# Patient Record
Sex: Female | Born: 1985 | Race: Black or African American | Hispanic: No | Marital: Single | Smoking: Never smoker
Health system: Southern US, Community
[De-identification: ages and names within clinical notes are randomized; demographics above are authoritative.]

## PROBLEM LIST (undated history)

## (undated) DIAGNOSIS — A630 Anogenital (venereal) warts: Secondary | ICD-10-CM

## (undated) DIAGNOSIS — Z8619 Personal history of other infectious and parasitic diseases: Secondary | ICD-10-CM

## (undated) DIAGNOSIS — D649 Anemia, unspecified: Secondary | ICD-10-CM

## (undated) DIAGNOSIS — B009 Herpesviral infection, unspecified: Secondary | ICD-10-CM

## (undated) HISTORY — DX: Anogenital (venereal) warts: A63.0

## (undated) HISTORY — DX: Anemia, unspecified: D64.9

## (undated) HISTORY — DX: Personal history of other infectious and parasitic diseases: Z86.19

## (undated) HISTORY — DX: Herpesviral infection, unspecified: B00.9

---

## 2006-07-25 HISTORY — PX: CHOLECYSTECTOMY: SHX55

## 2008-02-06 ENCOUNTER — Emergency Department (HOSPITAL_COMMUNITY): Admission: EM | Admit: 2008-02-06 | Discharge: 2008-02-06 | Payer: Self-pay | Admitting: Pediatrics

## 2010-07-21 ENCOUNTER — Other Ambulatory Visit
Admission: RE | Admit: 2010-07-21 | Discharge: 2010-07-21 | Payer: Self-pay | Source: Home / Self Care | Admitting: Obstetrics and Gynecology

## 2011-04-22 LAB — URINE CULTURE: Colony Count: >=100000

## 2011-04-22 LAB — POCT PREGNANCY, URINE: Preg Test, Ur: NEGATIVE

## 2011-04-22 LAB — URINE MICROSCOPIC-ADD ON

## 2011-04-22 LAB — URINALYSIS, ROUTINE W REFLEX MICROSCOPIC
Glucose, UA: NEGATIVE
Ketones, ur: NEGATIVE
Nitrite: NEGATIVE
Specific Gravity, Urine: 1.024
pH: 6

## 2011-04-22 LAB — RPR: RPR Ser Ql: NONREACTIVE

## 2011-04-22 LAB — WET PREP, GENITAL: Trich, Wet Prep: NONE SEEN

## 2011-08-07 ENCOUNTER — Ambulatory Visit (INDEPENDENT_AMBULATORY_CARE_PROVIDER_SITE_OTHER): Payer: Managed Care, Other (non HMO)

## 2011-08-07 DIAGNOSIS — H9209 Otalgia, unspecified ear: Secondary | ICD-10-CM

## 2012-01-28 ENCOUNTER — Ambulatory Visit (INDEPENDENT_AMBULATORY_CARE_PROVIDER_SITE_OTHER): Payer: Managed Care, Other (non HMO) | Admitting: Physician Assistant

## 2012-01-28 VITALS — BP 128/78 | HR 68 | Temp 97.4°F | Resp 17 | Ht 64.0 in | Wt 169.0 lb

## 2012-01-28 DIAGNOSIS — N760 Acute vaginitis: Secondary | ICD-10-CM

## 2012-01-28 DIAGNOSIS — R1033 Periumbilical pain: Secondary | ICD-10-CM

## 2012-01-28 DIAGNOSIS — R350 Frequency of micturition: Secondary | ICD-10-CM

## 2012-01-28 DIAGNOSIS — N949 Unspecified condition associated with female genital organs and menstrual cycle: Secondary | ICD-10-CM

## 2012-01-28 LAB — POCT UA - MICROSCOPIC ONLY

## 2012-01-28 LAB — POCT CBC
Lymph, poc: 2.1 (ref 0.6–3.4)
MCH, POC: 30.8 pg (ref 27–31.2)
MCHC: 31.5 g/dL — AB (ref 31.8–35.4)
MCV: 98 fL — AB (ref 80–97)
MID (cbc): 0.3 (ref 0–0.9)
POC LYMPH PERCENT: 28.7 %L (ref 10–50)
Platelet Count, POC: 241 10*3/uL (ref 142–424)
RBC: 3.99 M/uL — AB (ref 4.04–5.48)
RDW, POC: 12.6 %
WBC: 7.4 10*3/uL (ref 4.6–10.2)

## 2012-01-28 LAB — COMPREHENSIVE METABOLIC PANEL
Albumin: 4.1 g/dL (ref 3.5–5.2)
BUN: 13 mg/dL (ref 6–23)
Calcium: 9.1 mg/dL (ref 8.4–10.5)
Chloride: 106 mEq/L (ref 96–112)
Creat: 0.86 mg/dL (ref 0.50–1.10)
Glucose, Bld: 92 mg/dL (ref 70–99)
Potassium: 4.3 mEq/L (ref 3.5–5.3)

## 2012-01-28 LAB — POCT URINALYSIS DIPSTICK
Bilirubin, UA: NEGATIVE
Blood, UA: NEGATIVE
Glucose, UA: NEGATIVE
Ketones, UA: NEGATIVE
Leukocytes, UA: NEGATIVE
Nitrite, UA: NEGATIVE

## 2012-01-28 LAB — POCT WET PREP WITH KOH
Clue Cells Wet Prep HPF POC: 60
Trichomonas, UA: NEGATIVE

## 2012-01-28 MED ORDER — METRONIDAZOLE 500 MG PO TABS: 500.0000 mg | ORAL_TABLET | Freq: Two times a day (BID) | ORAL | Status: DC

## 2012-01-28 MED ORDER — METRONIDAZOLE 0.75 % VA GEL: 1.0000 | Freq: Two times a day (BID) | VAGINAL | Status: AC

## 2012-01-28 NOTE — Progress Notes (Signed)
Subjective:    Patient ID: Erin English, female    DOB: [birth year], 26 y.o.   MRN: [id number]  HPI 26 yr old AAF presents with urinary frequency and lower abdominal pain for the past 2 weeks.  She has also had some tingling/numbness in the vulva.  Her pap smear was done in march and was normal.  Her appetite is normal.  No f/c.  Denies STD risk factors.  No changes in BMs.  No melena or hematochezia.  Review of Systems  All other systems reviewed and are negative.       Objective:   Physical Exam  Nursing note and vitals reviewed. Constitutional: She is oriented to person, place, and time. She appears well-developed and well-nourished.  HENT:  Head: Normocephalic and atraumatic.  Neck: Normal range of motion. Neck supple.  Cardiovascular: Normal rate, regular rhythm and normal heart sounds.  Exam reveals no gallop and no friction rub.   No murmur heard. Pulmonary/Chest: Effort normal and breath sounds normal.  Abdominal: Soft. Bowel sounds are normal. She exhibits no distension and no mass. There is no tenderness. There is no rebound and no guarding.       Unable to reproduce or illicit the pain with palpation, but it is just below her umbilicus where she points  Genitourinary: Uterus normal. Vaginal discharge (yellowish d/c in vault.  Cervix WNL genprobe and wet prep taken. neg chandelier sign/bimanual unremarkable.) found.       No vesicles or rash, but I swabbed the area where she has had numbness for HSV.   Neurological: She is alert and oriented to person, place, and time.  Skin: Skin is warm and dry.      Results for orders placed in visit on 01/28/12  POCT URINALYSIS DIPSTICK      Component Value Range   Color, UA yellow     Clarity, UA hazy     Glucose, UA neg     Bilirubin, UA neg     Ketones, UA neg     Spec Grav, UA >=1.030     Blood, UA neg     pH, UA 5.5     Protein, UA neg     Urobilinogen, UA 0.2     Nitrite, UA neg     Leukocytes, UA Negative    POCT  UA - MICROSCOPIC ONLY      Component Value Range   WBC, Ur, HPF, POC 2-5     RBC, urine, microscopic 0-2     Bacteria, U Microscopic 1+     Mucus, UA small     Epithelial cells, urine per micros 3-6     Crystals, Ur, HPF, POC neg     Casts, Ur, LPF, POC neg     Yeast, UA neg    POCT WET PREP WITH KOH      Component Value Range   Trichomonas, UA Negative     Clue Cells Wet Prep HPF POC 60 %     Epithelial Wet Prep HPF POC 5-7     Yeast Wet Prep HPF POC neg     Bacteria Wet Prep HPF POC 2+     RBC Wet Prep HPF POC neg     WBC Wet Prep HPF POC 0-1     KOH Prep POC Negative    POCT CBC      Component Value Range   WBC 7.4  4.6 - 10.2 K/uL   Lymph, poc 2.1  0.6 - 3.4  POC LYMPH PERCENT 28.7  10 - 50 %L   MID (cbc) 0.3  0 - 0.9   POC MID % 4.5  0 - 12 %M   POC Granulocyte 4.9  2 - 6.9   Granulocyte percent 66.8  37 - 80 %G   RBC 3.99 (*) 4.04 - 5.48 M/uL   Hemoglobin 12.3  12.2 - 16.2 g/dL   HCT, POC 40.9  81.1 - 47.9 %   MCV 98.0 (*) 80 - 97 fL   MCH, POC 30.8  27 - 31.2 pg   MCHC 31.5 (*) 31.8 - 35.4 g/dL   RDW, POC 91.4     Platelet Count, POC 241  142 - 424 K/uL   MPV 10.8  0 - 99.8 fL  POCT URINE PREGNANCY      Component Value Range   Preg Test, Ur Negative         Assessment & Plan:  Abdominal

## 2012-01-30 LAB — GC/CHLAMYDIA PROBE AMP, GENITAL: GC Probe Amp, Genital: NEGATIVE

## 2012-10-04 ENCOUNTER — Other Ambulatory Visit (HOSPITAL_COMMUNITY)
Admission: RE | Admit: 2012-10-04 | Discharge: 2012-10-04 | Disposition: A | Discharge status: Home / Self Care | Payer: Managed Care, Other (non HMO) | Source: Ambulatory Visit | Attending: Obstetrics and Gynecology | Admitting: Obstetrics and Gynecology

## 2012-10-04 ENCOUNTER — Other Ambulatory Visit: Payer: Self-pay | Admitting: Nurse Practitioner

## 2012-10-04 DIAGNOSIS — Z113 Encounter for screening for infections with a predominantly sexual mode of transmission: Secondary | ICD-10-CM | POA: Insufficient documentation

## 2012-10-04 DIAGNOSIS — N760 Acute vaginitis: Secondary | ICD-10-CM | POA: Insufficient documentation

## 2013-04-12 ENCOUNTER — Telehealth: Payer: Self-pay

## 2013-04-12 NOTE — Telephone Encounter (Signed)
Pt has questions about her chart that she has read in my chart\  Best number [phone]

## 2013-04-13 NOTE — Telephone Encounter (Signed)
lmom to cb. 

## 2013-04-22 NOTE — Telephone Encounter (Signed)
Called again. Left message for her to call or send me a mychart message if this is easier for her.

## 2013-05-08 ENCOUNTER — Ambulatory Visit (INDEPENDENT_AMBULATORY_CARE_PROVIDER_SITE_OTHER): Payer: Managed Care, Other (non HMO) | Admitting: Physician Assistant

## 2013-05-08 VITALS — BP 108/80 | HR 98 | Temp 97.5°F | Resp 16 | Ht 65.25 in | Wt 166.0 lb

## 2013-05-08 DIAGNOSIS — B9689 Other specified bacterial agents as the cause of diseases classified elsewhere: Secondary | ICD-10-CM

## 2013-05-08 DIAGNOSIS — R350 Frequency of micturition: Secondary | ICD-10-CM

## 2013-05-08 DIAGNOSIS — N898 Other specified noninflammatory disorders of vagina: Secondary | ICD-10-CM

## 2013-05-08 DIAGNOSIS — L293 Anogenital pruritus, unspecified: Secondary | ICD-10-CM

## 2013-05-08 DIAGNOSIS — N760 Acute vaginitis: Secondary | ICD-10-CM

## 2013-05-08 LAB — POCT URINALYSIS DIPSTICK
Bilirubin, UA: NEGATIVE
Blood, UA: NEGATIVE
Nitrite, UA: NEGATIVE
pH, UA: 5.5

## 2013-05-08 LAB — POCT WET PREP WITH KOH: Yeast Wet Prep HPF POC: NEGATIVE

## 2013-05-08 LAB — POCT UA - MICROSCOPIC ONLY
Mucus, UA: NEGATIVE
WBC, Ur, HPF, POC: NEGATIVE
Yeast, UA: NEGATIVE

## 2013-05-08 MED ORDER — METRONIDAZOLE 0.75 % VA GEL: 1.0000 | Freq: Two times a day (BID) | VAGINAL | Status: DC

## 2013-05-08 NOTE — Progress Notes (Signed)
Subjective:    Patient ID: Erin English, female    DOB: April 11, 1986, 27 y.o.   MRN: [id number]  HPI   Erin English is a very pleasant 27 yr old female here with concern that she may have a UTI or yeast infection.  Reports frequency, urgency, and vaginal itching for 4 days.  Denies dysuria or hematuria.  No abd pain, flank pain, NV, FC.  No vaginal discharge or abnormal bleeding.  LMP two weeks ago.  Sexually active with 1 female partner.   No concern for STIs, declines testing today.  OCP for contraception    Review of Systems  Constitutional: Negative for fever and chills.  HENT: Negative.   Respiratory: Negative.   Cardiovascular: Negative.   Gastrointestinal: Negative for nausea, vomiting and abdominal pain.  Genitourinary: Positive for urgency and frequency. Negative for dysuria, hematuria, flank pain, vaginal bleeding, vaginal discharge, genital sores and menstrual problem.  Musculoskeletal: Negative.   Skin: Negative.   Neurological: Negative.        Objective:   Physical Exam  Vitals reviewed. Constitutional: She is oriented to person, place, and time. She appears well-developed and well-nourished. No distress.  HENT:  Head: Normocephalic and atraumatic.  Eyes: Conjunctivae are normal. No scleral icterus.  Cardiovascular: Normal rate, regular rhythm and normal heart sounds.   Pulmonary/Chest: Effort normal and breath sounds normal. She has no wheezes. She has no rales.  Abdominal: Soft. There is no tenderness.  Genitourinary: Uterus normal. Cervix exhibits no motion tenderness, no discharge and no friability. Right adnexum displays no mass, no tenderness and no fullness. Left adnexum displays no mass, no tenderness and no fullness. Vaginal discharge (small amount of thin white discharge) found.  Neurological: She is alert and oriented to person, place, and time.  Skin: Skin is warm and dry.  Psychiatric: She has a normal mood and affect. Her behavior is normal.    Results  for orders placed in visit on 05/08/13  POCT UA - MICROSCOPIC ONLY      Result Value Range   WBC, Ur, HPF, POC neg     RBC, urine, microscopic neg     Bacteria, U Microscopic neg     Mucus, UA neg     Epithelial cells, urine per micros 0-2     Crystals, Ur, HPF, POC neg     Casts, Ur, LPF, POC neg     Yeast, UA neg    POCT URINALYSIS DIPSTICK      Result Value Range   Color, UA yellow     Clarity, UA clear     Glucose, UA neg     Bilirubin, UA neg     Ketones, UA 15     Spec Grav, UA 1.025     Blood, UA neg     pH, UA 5.5     Protein, UA 100     Urobilinogen, UA 1.0     Nitrite, UA neg     Leukocytes, UA Negative    POCT WET PREP WITH KOH      Result Value Range   Trichomonas, UA Negative     Clue Cells Wet Prep HPF POC 0-2     Epithelial Wet Prep HPF POC 3-6     Yeast Wet Prep HPF POC neg     Bacteria Wet Prep HPF POC 1+     RBC Wet Prep HPF POC 0-2     WBC Wet Prep HPF POC 10-12     KOH Prep POC  Negative         Assessment & Plan:  Bacterial vaginosis - Plan: metroNIDAZOLE (METROGEL) 0.75 % vaginal gel  Increased frequency of urination - Plan: POCT UA - Microscopic Only, POCT urinalysis dipstick, Urine culture  Vaginal itching - Plan: POCT Wet Prep with KOH   Erin English is a very pleasant 27 yr old female with bacterial vaginosis.  Pt requests metrogel.   UA has no leuks or nitrite, so less likely UTI but will send cx to confirm given symptoms.  Will follow up on cx and treat if necessary.  Pt to RTC if worsening or not improving.

## 2013-05-08 NOTE — Patient Instructions (Signed)
Begin using the metrogel twice daily.  I will let you know what your urine culture shows and if we need to do antibiotics for a urinary tract infection.  Please let me know if any symptoms are worsening or not improving   Bacterial Vaginosis Bacterial vaginosis (BV) is a vaginal infection where the normal balance of bacteria in the vagina is disrupted. The normal balance is then replaced by an overgrowth of certain bacteria. There are several different kinds of bacteria that can cause BV. BV is the most common vaginal infection in women of childbearing age. CAUSES   The cause of BV is not fully understood. BV develops when there is an increase or imbalance of harmful bacteria.  Some activities or behaviors can upset the normal balance of bacteria in the vagina and put women at increased risk including:  Having a new sex partner or multiple sex partners.  Douching.  Using an intrauterine device (IUD) for contraception.  It is not clear what role sexual activity plays in the development of BV. However, women that have never had sexual intercourse are rarely infected with BV. Women do not get BV from toilet seats, bedding, swimming pools or from touching objects around them.  SYMPTOMS   Grey vaginal discharge.  A fish-like odor with discharge, especially after sexual intercourse.  Itching or burning of the vagina and vulva.  Burning or pain with urination.  Some women have no signs or symptoms at all. DIAGNOSIS  Your caregiver must examine the vagina for signs of BV. Your caregiver will perform lab tests and look at the sample of vaginal fluid through a microscope. They will look for bacteria and abnormal cells (clue cells), a pH test higher than 4.5, and a positive amine test all associated with BV.  RISKS AND COMPLICATIONS   Pelvic inflammatory disease (PID).  Infections following gynecology surgery.  Developing HIV.  Developing herpes virus. TREATMENT  Sometimes BV will  clear up without treatment. However, all women with symptoms of BV should be treated to avoid complications, especially if gynecology surgery is planned. Female partners generally do not need to be treated. However, BV may spread between female sex partners so treatment is helpful in preventing a recurrence of BV.   BV may be treated with antibiotics. The antibiotics come in either pill or vaginal cream forms. Either can be used with nonpregnant or pregnant women, but the recommended dosages differ. These antibiotics are not harmful to the baby.  BV can recur after treatment. If this happens, a second round of antibiotics will often be prescribed.  Treatment is important for pregnant women. If not treated, BV can cause a premature delivery, especially for a pregnant woman who had a premature birth in the past. All pregnant women who have symptoms of BV should be checked and treated.  For chronic reoccurrence of BV, treatment with a type of prescribed gel vaginally twice a week is helpful. HOME CARE INSTRUCTIONS   Finish all medication as directed by your caregiver.  Do not have sex until treatment is completed.  Tell your sexual partner that you have a vaginal infection. They should see their caregiver and be treated if they have problems, such as a mild rash or itching.  Practice safe sex. Use condoms. Only have 1 sex partner. PREVENTION  Basic prevention steps can help reduce the risk of upsetting the natural balance of bacteria in the vagina and developing BV:  Do not have sexual intercourse (be abstinent).  Do not douche.  Use all of the medicine prescribed for treatment of BV, even if the signs and symptoms go away.  Tell your sex partner if you have BV. That way, they can be treated, if needed, to prevent reoccurrence. SEEK MEDICAL CARE IF:   Your symptoms are not improving after 3 days of treatment.  You have increased discharge, pain, or fever. MAKE SURE YOU:   Understand  these instructions.  Will watch your condition.  Will get help right away if you are not doing well or get worse. FOR MORE INFORMATION  Division of STD Prevention (DSTDP), Centers for Disease Control and Prevention: [employer] American Social Health Association (ASHA): www.ashastd.org  Document Released: 07/11/2005 Document Revised: 10/03/2011 Document Reviewed: 01/01/2009 [facility] Patient Information 2014 [place], [place].

## 2013-05-11 LAB — URINE CULTURE

## 2013-05-13 ENCOUNTER — Other Ambulatory Visit: Payer: Self-pay | Admitting: Physician Assistant

## 2013-05-13 ENCOUNTER — Telehealth: Payer: Self-pay

## 2013-05-13 NOTE — Telephone Encounter (Signed)
Erin Manis, do you want to RF this or does pt need to RTC for recheck?

## 2013-05-13 NOTE — Telephone Encounter (Signed)
metroNIDAZOLE (METROGEL) 0.75 % vaginal gel .  Requesting more will run out today   [phone]

## 2013-06-03 ENCOUNTER — Ambulatory Visit (INDEPENDENT_AMBULATORY_CARE_PROVIDER_SITE_OTHER): Payer: Managed Care, Other (non HMO) | Admitting: Physician Assistant

## 2013-06-03 VITALS — BP 124/88 | HR 62 | Temp 98.6°F | Resp 18 | Ht 64.0 in | Wt 172.0 lb

## 2013-06-03 DIAGNOSIS — N760 Acute vaginitis: Secondary | ICD-10-CM

## 2013-06-03 DIAGNOSIS — B9689 Other specified bacterial agents as the cause of diseases classified elsewhere: Secondary | ICD-10-CM

## 2013-06-03 LAB — POCT WET PREP WITH KOH
KOH Prep POC: NEGATIVE
Yeast Wet Prep HPF POC: NEGATIVE

## 2013-06-03 MED ORDER — METRONIDAZOLE 0.75 % VA GEL: 1.0000 | VAGINAL | Status: DC

## 2013-06-03 MED ORDER — FLUCONAZOLE 150 MG PO TABS: 150.0000 mg | ORAL_TABLET | Freq: Once | ORAL | Status: DC

## 2013-06-03 NOTE — Patient Instructions (Signed)
Use the metrogel twice weekly for the next 4-6 weeks.  If symptoms recur, please come back in

## 2013-06-03 NOTE — Progress Notes (Signed)
Subjective:    Patient ID: Erin English, female    DOB: 1986-04-04, 27 y.o.   MRN: [id number]  HPI   Erin English is a very pleasant 27 yr old female here because she wants to make sure BV is gone.  Was last seen here 05/08/13, treated with metrogel.  She denies symptoms currently - no discharge or odor.  No urinary symptoms.  No abd pain.  LMP 05/26/13.  OCPs for contraception.  Sexually active with 1 partner, consistent condom use.    Pt unsure of BV triggers.  Symptoms seem to happen randomly.    Review of Systems  Constitutional: Negative.   HENT: Negative.   Respiratory: Negative.   Cardiovascular: Negative.   Gastrointestinal: Negative.   Genitourinary: Negative for dysuria, frequency, hematuria, vaginal bleeding, vaginal discharge, menstrual problem and pelvic pain.  Musculoskeletal: Negative.   Skin: Negative.   Neurological: Negative.        Objective:   Physical Exam  Vitals reviewed. Constitutional: She is oriented to person, place, and time. She appears well-developed and well-nourished. No distress.  HENT:  Head: Normocephalic and atraumatic.  Eyes: Conjunctivae are normal. No scleral icterus.  Cardiovascular: Normal rate, regular rhythm and normal heart sounds.   Pulmonary/Chest: Effort normal and breath sounds normal. She has no wheezes. She has no rales.  Abdominal: Soft. Bowel sounds are normal. There is no tenderness.  Neurological: She is alert and oriented to person, place, and time.  Skin: Skin is warm and dry.  Psychiatric: She has a normal mood and affect. Her behavior is normal.     Self collected wet prep Results for orders placed in visit on 06/03/13  POCT WET PREP WITH KOH      Result Value Range   Trichomonas, UA Negative     Clue Cells Wet Prep HPF POC 5-10     Epithelial Wet Prep HPF POC 10-20     Yeast Wet Prep HPF POC negative     Bacteria Wet Prep HPF POC 4+     RBC Wet Prep HPF POC 0-2     WBC Wet Prep HPF POC 10-15     KOH Prep POC  Negative          Assessment & Plan:  Bacterial vaginosis - Plan: POCT Wet Prep with KOH, metroNIDAZOLE (METROGEL) 0.75 % vaginal gel   Erin English is a pleasant 27 yr old female here for recheck on BV.  She is currently asymptomatic but self-collected wet prep shows ~50% clues still.  Because of this, will try twice weekly vaginal metrogel x 4-6 months - per uptodate recommendations for recurrent BV.  If pt becomes symptomatic, RTC for further eval.  Would favor trial of PO tindamax if BV recurs.  Pt understands and agrees with this plan.    Meds ordered this encounter  Medications  . fluconazole (DIFLUCAN) 150 MG tablet    Sig: Take 1 tablet (150 mg total) by mouth once. Repeat if needed    Dispense:  2 tablet    Refill:  0    Order Specific Question:  Supervising Provider    Answer:  Ethelda Chick [[id number]]  . metroNIDAZOLE (METROGEL) 0.75 % vaginal gel    Sig: Place 1 Applicatorful vaginally 2 (two) times a week.    Dispense:  70 g    Refill:  2    Order Specific Question:  Supervising Provider    Answer:  Ethelda Chick [[id number]]   Loleta Dicker MHS,  PA-C [facility] 11/10/20148:33 PM

## 2013-08-06 ENCOUNTER — Ambulatory Visit (INDEPENDENT_AMBULATORY_CARE_PROVIDER_SITE_OTHER): Payer: Managed Care, Other (non HMO) | Admitting: Emergency Medicine

## 2013-08-06 ENCOUNTER — Ambulatory Visit: Payer: Managed Care, Other (non HMO)

## 2013-08-06 VITALS — BP 132/80 | HR 76 | Temp 98.8°F | Resp 16 | Ht 64.5 in | Wt 183.0 lb

## 2013-08-06 DIAGNOSIS — M79609 Pain in unspecified limb: Secondary | ICD-10-CM

## 2013-08-06 DIAGNOSIS — S8012XA Contusion of left lower leg, initial encounter: Secondary | ICD-10-CM

## 2013-08-06 DIAGNOSIS — S8010XA Contusion of unspecified lower leg, initial encounter: Secondary | ICD-10-CM

## 2013-08-06 MED ORDER — NAPROXEN SODIUM 550 MG PO TABS: 550.0000 mg | ORAL_TABLET | Freq: Two times a day (BID) | ORAL | Status: AC

## 2013-08-06 NOTE — Progress Notes (Signed)
[facility] [address], [place] [id number] [phone]  Date:  08/06/2013   Name:  Erin RevelRochelle English   DOB:  06/20/1986   MRN:  [id number]  PCP:  No primary provider on file.    Chief Complaint: Leg Swelling   History of Present Illness:  Erin English is a 28 y.o. very pleasant female patient who presents with the following:  Injured left lower leg when she bumped it on a bedpost three months ago.  The ecchymosis has resolved.  She did not pain attention until she had a pedicure just before Christmas and found a tender spot in the location of the injury.  It has remained tender and swollen.  No improvement with over the counter medications or other home remedies. Denies other complaint or health concern today.   There are no active problems to display for this patient.   History reviewed. No pertinent past medical history.  Past Surgical History  Procedure Laterality Date  . Cholecystectomy      History  Substance Use Topics  . Smoking status: Never Smoker   . Smokeless tobacco: Never Used  . Alcohol Use: Yes     Comment: social    Family History  Problem Relation Age of Onset  . Hypertension Father     No Known Allergies  Medication list has been reviewed and updated.  Current Outpatient Prescriptions on File Prior to Visit  Medication Sig Dispense Refill  . metroNIDAZOLE (METROGEL) 0.75 % vaginal gel Place 1 Applicatorful vaginally 2 (two) times a week.  70 g  2  . norethindrone-ethinyl estradiol (NECON,BREVICON,MODICON) 0.5-35 MG-MCG tablet Take 1 tablet by mouth daily.      . phentermine (ADIPEX-P) 37.5 MG tablet Take 37.5 mg by mouth daily before breakfast.      . fluconazole (DIFLUCAN) 150 MG tablet Take 1 tablet (150 mg total) by mouth once. Repeat if needed  2 tablet  0   No current facility-administered medications on file prior to visit.    Review of Systems:  As per HPI, otherwise negative.    Physical  Examination: Filed Vitals:   08/06/13 1912  BP: 132/80  Pulse: 76  Temp: 98.8 F (37.1 C)  Resp: 16   Filed Vitals:   08/06/13 1912  Height: 5' 4.5" (1.638 m)  Weight: 183 lb (83.008 kg)   Body mass index is 30.94 kg/(m^2). Ideal Body Weight: Weight in (lb) to have BMI = 25: 147.6   GEN: WDWN, NAD, Non-toxic, Alert & Oriented x 3 HEENT: Atraumatic, Normocephalic.  Ears and Nose: No external deformity. EXTR: No clubbing/cyanosis/edema NEURO: Normal gait.  PSYCH: Normally interactive. Conversant. Not depressed or anxious appearing.  Calm demeanor.  LEFT leg:  Tender swollen area medial lower leg just proximal to medial malleolus.  No ecchymosis   Assessment and Plan: Contusion leg Anaprox  Signed,  Phillips OdorJeffery Elberta Lachapelle, MD   [facility] reading (PRIMARY) by  Dr. Dareen PianoAnderson. Negative .

## 2013-08-06 NOTE — Patient Instructions (Signed)

## 2013-11-23 ENCOUNTER — Ambulatory Visit (INDEPENDENT_AMBULATORY_CARE_PROVIDER_SITE_OTHER): Payer: Managed Care, Other (non HMO) | Admitting: Family Medicine

## 2013-11-23 VITALS — BP 92/70 | HR 74 | Temp 98.3°F | Ht 63.0 in | Wt 186.0 lb

## 2013-11-23 DIAGNOSIS — N760 Acute vaginitis: Secondary | ICD-10-CM

## 2013-11-23 LAB — POCT WET PREP WITH KOH
Clue Cells Wet Prep HPF POC: 100
KOH Prep POC: NEGATIVE
RBC Wet Prep HPF POC: NEGATIVE
Trichomonas, UA: NEGATIVE
Yeast Wet Prep HPF POC: NEGATIVE

## 2013-11-23 MED ORDER — METRONIDAZOLE 500 MG PO TABS
500.0000 mg | ORAL_TABLET | Freq: Two times a day (BID) | ORAL | Status: AC
Start: 2013-11-23 — End: 2013-12-03

## 2013-11-23 MED ORDER — FLUCONAZOLE 150 MG PO TABS: 150.0000 mg | ORAL_TABLET | Freq: Once | ORAL | Status: DC

## 2013-11-23 NOTE — Addendum Note (Signed)
Addended by: Elvina SidleLAUENSTEIN, Makaley Storts on: 11/23/2013 10:06 AM   Modules accepted: Orders

## 2013-11-23 NOTE — Progress Notes (Signed)
   Subjective:    Patient ID: Erin English, female    DOB: 10/10/1985, 28 y.o.   MRN: [id number]  HPI 28 y.o. Female presents to clinic with vaginal itching. Denies any itching or discharge   Works in Audiological scientistaccounting Review of Systems No recent antibiotics, patient has had bacterial vaginosis, has not tried [facility], no fever    Objective:   Physical Exam HEENT:  Normal NAD Chest:  Clear Abdomen:  Soft, nontender  Exam: Creamy white discharge at introitus and throughout the vagina, and mild erythema along the walls of the vagina, nontender, no external Results for orders placed in visit on 11/23/13  POCT WET PREP WITH KOH      Result Value Ref Range   Trichomonas, UA Negative     Clue Cells Wet Prep HPF POC 100%     Epithelial Wet Prep HPF POC 6-12     Yeast Wet Prep HPF POC neg     Bacteria Wet Prep HPF POC 4+     RBC Wet Prep HPF POC neg     WBC Wet Prep HPF POC 4-10     KOH Prep POC Negative         Assessment & Plan:  Vaginitis and vulvovaginitis - Plan: POCT Wet Prep with KOH, metroNIDAZOLE (FLAGYL) 500 MG tablet  Signed, [name], MD

## 2013-11-23 NOTE — Patient Instructions (Signed)
Vaginitis Vaginitis is an inflammation of the vagina. It is most often caused by a change in the normal balance of the bacteria and yeast that live in the vagina. This change in balance causes an overgrowth of certain bacteria or yeast, which causes the inflammation. There are different types of vaginitis, but the most common types are:  Bacterial vaginosis.  Yeast infection (candidiasis).  Trichomoniasis vaginitis. This is a sexually transmitted infection (STI).  Viral vaginitis.  Atropic vaginitis.  Allergic vaginitis. CAUSES  The cause depends on the type of vaginitis. Vaginitis can be caused by:  Bacteria (bacterial vaginosis).  Yeast (yeast infection).  A parasite (trichomoniasis vaginitis)  A virus (viral vaginitis).  Low hormone levels (atrophic vaginitis). Low hormone levels can occur during pregnancy, breastfeeding, or after menopause.  Irritants, such as bubble baths, scented tampons, and feminine sprays (allergic vaginitis). Other factors can change the normal balance of the yeast and bacteria that live in the vagina. These include:  Antibiotic medicines.  Poor hygiene.  Diaphragms, vaginal sponges, spermicides, birth control pills, and intrauterine devices (IUD).  Sexual intercourse.  Infection.  Uncontrolled diabetes.  A weakened immune system. SYMPTOMS  Symptoms can vary depending on the cause of the vaginitis. Common symptoms include:  Abnormal vaginal discharge.  The discharge is white, gray, or yellow with bacterial vaginosis.  The discharge is thick, white, and cheesy with a yeast infection.  The discharge is frothy and yellow or greenish with trichomoniasis.  A bad vaginal odor.  The odor is fishy with bacterial vaginosis.  Vaginal itching, pain, or swelling.  Painful intercourse.  Pain or burning when urinating. Sometimes, there are no symptoms. TREATMENT  Treatment will vary depending on the type of infection.   Bacterial  vaginosis and trichomoniasis are often treated with antibiotic creams or pills.  Yeast infections are often treated with antifungal medicines, such as vaginal creams or suppositories.  Viral vaginitis has no cure, but symptoms can be treated with medicines that relieve discomfort. Your sexual partner should be treated as well.  Atrophic vaginitis may be treated with an estrogen cream, pill, suppository, or vaginal ring. If vaginal dryness occurs, lubricants and moisturizing creams may help. You may be told to avoid scented soaps, sprays, or douches.  Allergic vaginitis treatment involves quitting the use of the product that is causing the problem. Vaginal creams can be used to treat the symptoms. HOME CARE INSTRUCTIONS   Take all medicines as directed by your caregiver.  Keep your genital area clean and dry. Avoid soap and only rinse the area with water.  Avoid douching. It can remove the healthy bacteria in the vagina.  Do not use tampons or have sexual intercourse until your vaginitis has been treated. Use sanitary pads while you have vaginitis.  Wipe from front to back. This avoids the spread of bacteria from the rectum to the vagina.  Let air reach your genital area.  Wear cotton underwear to decrease moisture buildup.  Avoid wearing underwear while you sleep until your vaginitis is gone.  Avoid tight pants and underwear or nylons without a cotton panel.  Take off wet clothing (especially bathing suits) as soon as possible.  Use mild, non-scented products. Avoid using irritants, such as:  Scented feminine sprays.  Fabric softeners.  Scented detergents.  Scented tampons.  Scented soaps or bubble baths.  Practice safe sex and use condoms. Condoms may prevent the spread of trichomoniasis and viral vaginitis. SEEK MEDICAL CARE IF:   You have abdominal pain.  You   have a fever or persistent symptoms for more than 2 3 days.  You have a fever and your symptoms suddenly  get worse. Document Released: 05/08/2007 Document Revised: 04/04/2012 Document Reviewed: 12/22/2011 [employer] Patient Information 2014 [employer].  

## 2013-12-16 ENCOUNTER — Ambulatory Visit (INDEPENDENT_AMBULATORY_CARE_PROVIDER_SITE_OTHER): Payer: Managed Care, Other (non HMO) | Admitting: Family Medicine

## 2013-12-16 VITALS — BP 104/70 | HR 87 | Temp 98.1°F | Resp 16 | Ht 64.75 in | Wt 186.0 lb

## 2013-12-16 DIAGNOSIS — R300 Dysuria: Secondary | ICD-10-CM

## 2013-12-16 DIAGNOSIS — N760 Acute vaginitis: Secondary | ICD-10-CM

## 2013-12-16 DIAGNOSIS — R309 Painful micturition, unspecified: Secondary | ICD-10-CM

## 2013-12-16 LAB — POCT URINALYSIS DIPSTICK
BILIRUBIN UA: NEGATIVE
Glucose, UA: 100
KETONES UA: NEGATIVE
Nitrite, UA: POSITIVE
PH UA: 5
PROTEIN UA: 30
RBC UA: NEGATIVE
SPEC GRAV UA: 1.01
Urobilinogen, UA: 1

## 2013-12-16 MED ORDER — CIPROFLOXACIN HCL 500 MG PO TABS: 500.0000 mg | ORAL_TABLET | Freq: Two times a day (BID) | ORAL | Status: DC

## 2013-12-16 MED ORDER — FLUCONAZOLE 150 MG PO TABS: 150.0000 mg | ORAL_TABLET | Freq: Once | ORAL | Status: DC

## 2013-12-16 NOTE — Progress Notes (Signed)
Subjective:    Patient ID: Erin English, female    DOB: 06/14/1986, 28 y.o.   MRN: [id number]  Vaginal Discharge The patient's primary symptoms include a vaginal discharge. Associated symptoms include dysuria.   Chief Complaint  Patient presents with   Vaginal Discharge    X Friday   Painful urination    X Friday   This chart was for [name], MD by Andrew Auaven Small, ED Scribe. This patient was seen in room 9 and the patient's care was started at 3:46 PM.  HPI Comments: Erin English is a 28 y.o. female who presents to the [facility] complaining of dysuria onset 3 days with associated decreased urine and vaginal discharge.  Pt reports she has been treated for yeast infection 4 weeks ago and was prescribed with flagyl. She reports she finished the dose about 1-2 week ago.  History reviewed. No pertinent past medical history. No Known Allergies Prior to Admission medications   Medication Sig Start Date End Date Taking? Authorizing Provider  norethindrone-ethinyl estradiol (NECON,BREVICON,MODICON) 0.5-35 MG-MCG tablet Take 1 tablet by mouth daily.   Yes Historical Provider, MD  fluconazole (DIFLUCAN) 150 MG tablet Take 1 tablet (150 mg total) by mouth once. Repeat if needed 11/23/13   [name], MD  naproxen sodium (ANAPROX DS) 550 MG tablet Take 1 tablet (550 mg total) by mouth 2 (two) times daily with a meal. 08/06/13 08/06/14  Phillips OdorJeffery Anderson, MD  phentermine (ADIPEX-P) 37.5 MG tablet Take 37.5 mg by mouth daily before breakfast.    Historical Provider, MD   Review of Systems  Genitourinary: Positive for dysuria, decreased urine volume and vaginal discharge.   Objective:   Physical Exam  Nursing note and vitals reviewed. Constitutional: She is oriented to person, place, and time. She appears well-developed and well-nourished. No distress.  HENT:  Head: Normocephalic and atraumatic.  Eyes: EOM are normal.  Neck: Neck supple. No tracheal  deviation present.  Cardiovascular: Normal rate.   Pulmonary/Chest: Effort normal. No respiratory distress.  Musculoskeletal: Normal range of motion.  Neurological: She is alert and oriented to person, place, and time.  Skin: Skin is warm and dry.  Psychiatric: She has a normal mood and affect. Her behavior is normal.   Results for orders placed in visit on 12/16/13  POCT URINALYSIS DIPSTICK      Result Value Ref Range   Color, UA orange     Clarity, UA clear     Glucose, UA 100     Bilirubin, UA neg     Ketones, UA neg     Spec Grav, UA 1.010     Blood, UA neg     pH, UA 5.0     Protein, UA 30     Urobilinogen, UA 1.0     Nitrite, UA pos     Leukocytes, UA large (3+)         Assessment & Plan:   1. Painful urination   2. Vaginitis and vulvovaginitis    Meds ordered this encounter  Medications   fluconazole (DIFLUCAN) 150 MG tablet    Sig: Take 1 tablet (150 mg total) by mouth once. Repeat if needed    Dispense:  2 tablet    Refill:  3    Order Specific Question:  Supervising Provider    Answer:  Ethelda ChickSMITH, KRISTI M [[id number]]   ciprofloxacin (CIPRO) 500 MG tablet    Sig: Take 1 tablet (500 mg total) by mouth 2 (two) times  daily.    Dispense:  10 tablet    Refill:  0    Elvina Sidle, MD

## 2013-12-18 LAB — URINE CULTURE: Colony Count: 5000

## 2014-01-16 ENCOUNTER — Ambulatory Visit (INDEPENDENT_AMBULATORY_CARE_PROVIDER_SITE_OTHER): Payer: Managed Care, Other (non HMO) | Admitting: Family Medicine

## 2014-01-16 VITALS — BP 118/80 | HR 80 | Temp 97.7°F | Resp 14 | Ht 64.0 in | Wt 185.2 lb

## 2014-01-16 DIAGNOSIS — R300 Dysuria: Secondary | ICD-10-CM

## 2014-01-16 DIAGNOSIS — N760 Acute vaginitis: Secondary | ICD-10-CM

## 2014-01-16 DIAGNOSIS — A499 Bacterial infection, unspecified: Secondary | ICD-10-CM

## 2014-01-16 DIAGNOSIS — Z113 Encounter for screening for infections with a predominantly sexual mode of transmission: Secondary | ICD-10-CM

## 2014-01-16 DIAGNOSIS — B9689 Other specified bacterial agents as the cause of diseases classified elsewhere: Secondary | ICD-10-CM

## 2014-01-16 DIAGNOSIS — N898 Other specified noninflammatory disorders of vagina: Secondary | ICD-10-CM

## 2014-01-16 LAB — POCT WET PREP WITH KOH
KOH Prep POC: NEGATIVE
TRICHOMONAS UA: NEGATIVE
YEAST WET PREP PER HPF POC: NEGATIVE

## 2014-01-16 LAB — POCT URINALYSIS DIPSTICK
BILIRUBIN UA: NEGATIVE
Glucose, UA: NEGATIVE
KETONES UA: NEGATIVE
LEUKOCYTES UA: NEGATIVE
Nitrite, UA: NEGATIVE
PH UA: 5.5
Protein, UA: NEGATIVE
SPEC GRAV UA: 1.015
Urobilinogen, UA: 0.2

## 2014-01-16 LAB — POCT UA - MICROSCOPIC ONLY
CRYSTALS, UR, HPF, POC: NEGATIVE
Casts, Ur, LPF, POC: NEGATIVE
Mucus, UA: POSITIVE
WBC, UR, HPF, POC: NEGATIVE
Yeast, UA: NEGATIVE

## 2014-01-16 MED ORDER — METRONIDAZOLE 0.75 % VA GEL: 1.0000 | Freq: Two times a day (BID) | VAGINAL | Status: DC

## 2014-01-16 NOTE — Progress Notes (Signed)
Subjective:    Patient ID: Erin English, female    DOB: 03/15/1986, 28 y.o.   MRN: [id number]  HPI This is a very pleasant 28 yo female who presents today with complaints of intermittent lower left abdominal pain 5 and 6 days ago. This resolved. Was constipated during the time when she had abdominal pain. Took a laxative 2 days ago and had 3 bowel movements.Has noticed small amounts of urine with difficulty urinating 3 days ago. Noticed leaking of urine yesterday. Thinks she might have been dehydrated.    Noticed some anal itching a couple of days ago. Used OTC cortisone cream with relief of symptoms. Has noticed that anal itching sometimes precedes her vaginal yeast infections and would like to be checked today. She has not had new sexual partner, but would like to be checked for gonorrhea/chylmidia.   Review of Systems No fever, no chills, no dysuria, no hematuria, some nausea, no vomiting. No back pain, no vaginal discharge, no vaginal itching.     Objective:   Physical Exam  Vitals reviewed. Constitutional: She is oriented to person, place, and time. She appears well-developed and well-nourished.  HENT:  Head: Normocephalic and atraumatic.  Eyes: Conjunctivae are normal. Right eye exhibits no discharge. Left eye exhibits no discharge. No scleral icterus.  Neck: Normal range of motion. Neck supple.  Cardiovascular: Normal rate, regular rhythm and normal heart sounds.   Pulmonary/Chest: Effort normal and breath sounds normal.  Abdominal: Soft. Bowel sounds are normal. She exhibits no distension and no mass. There is no tenderness. There is no rebound, no guarding and no CVA tenderness.  Genitourinary: Pelvic exam was performed with patient prone. No labial fusion. There is no rash, tenderness, lesion or injury on the right labia. There is no rash, tenderness, lesion or injury on the left labia. No erythema, tenderness or bleeding around the vagina. No foreign body around the vagina. No  signs of injury around the vagina. Vaginal discharge: moderate amount thin white discharge.  Musculoskeletal: Normal range of motion.  Neurological: She is alert and oriented to person, place, and time.  Skin: Skin is warm and dry.  Psychiatric: She has a normal mood and affect. Her behavior is normal. Judgment and thought content normal.    Results for orders placed in visit on 01/16/14  POCT UA - MICROSCOPIC ONLY      Result Value Ref Range   WBC, Ur, HPF, POC NEG     RBC, urine, microscopic 0-6     Bacteria, U Microscopic TRACE     Mucus, UA POS     Epithelial cells, urine per micros 1-6     Crystals, Ur, HPF, POC NEG     Casts, Ur, LPF, POC NEG     Yeast, UA NEG    POCT URINALYSIS DIPSTICK      Result Value Ref Range   Color, UA YELLOW     Clarity, UA CLEAR     Glucose, UA NEG     Bilirubin, UA NEG     Ketones, UA NEG     Spec Grav, UA 1.015     Blood, UA TRACE INTACT     pH, UA 5.5     Protein, UA NEG     Urobilinogen, UA 0.2     Nitrite, UA NEG     Leukocytes, UA Negative    POCT WET PREP WITH KOH      Result Value Ref Range   Trichomonas, UA Negative  Clue Cells Wet Prep HPF POC 4-12     Epithelial Wet Prep HPF POC 3-16     Yeast Wet Prep HPF POC NEG     Bacteria Wet Prep HPF POC 4+     RBC Wet Prep HPF POC 0-6     WBC Wet Prep HPF POC 1-8     KOH Prep POC Negative         Assessment & Plan:  1. Dysuria- this was more difficulty urinating several days ago, patient thinks she was dehydrated - POCT UA - Microscopic Only - POCT urinalysis dipstick  2. Routine screening for STI (sexually transmitted infection) - GC/Chlamydia Probe Amp  3. Vaginal discharge - POCT Wet Prep with KOH  4. Bacterial vaginosis - metroNIDAZOLE (METROGEL VAGINAL) 0.75 % vaginal gel; Place 1 Applicatorful vaginally 2 (two) times daily.  Dispense: 70 g; Refill: 0  Provided information about constipation since this seems likely to have caused pain several days ago- now resolved.      Emi Belfasteborah B. Gessner, FNP-BC  [facility], [facility]  01/16/2014 9:45 AM

## 2014-01-16 NOTE — Patient Instructions (Signed)
Constipation  Constipation is when a person has fewer than three bowel movements a week, has difficulty having a bowel movement, or has stools that are dry, hard, or larger than normal. As people grow older, constipation is more common. If you try to fix constipation with medicines that make you have a bowel movement (laxatives), the problem may get worse. Long-term laxative use may cause the muscles of the colon to become weak. A low-fiber diet, not taking in enough fluids, and taking certain medicines may make constipation worse.   CAUSES   · Certain medicines, such as antidepressants, pain medicine, iron supplements, antacids, and water pills.    · Certain diseases, such as diabetes, irritable bowel syndrome (IBS), thyroid disease, or depression.    · Not drinking enough water.    · Not eating enough fiber-rich foods.    · Stress or travel.    · Lack of physical activity or exercise.    · Ignoring the urge to have a bowel movement.    · Using laxatives too much.    SIGNS AND SYMPTOMS   · Having fewer than three bowel movements a week.    · Straining to have a bowel movement.    · Having stools that are hard, dry, or larger than normal.    · Feeling full or bloated.    · Pain in the lower abdomen.    · Not feeling relief after having a bowel movement.    DIAGNOSIS   Your health care Erin Erin will take a medical history and perform a physical exam. Further testing may be done for severe constipation. Some tests may include:  · A barium enema X-ray to examine your rectum, colon, and, sometimes, your small intestine.    · A sigmoidoscopy to examine your lower colon.    · A colonoscopy to examine your entire colon.  TREATMENT   Treatment will depend on the severity of your constipation and what is causing it. Some dietary treatments include drinking more fluids and eating more fiber-rich foods. Lifestyle treatments may include regular exercise. If these diet and lifestyle recommendations do not help, your health care  Erin Erin may recommend taking over-the-counter laxative medicines to help you have bowel movements. Prescription medicines may be prescribed if over-the-counter medicines do not work.   HOME CARE INSTRUCTIONS   · Eat foods that have a lot of fiber, such as fruits, vegetables, whole grains, and beans.  · Limit foods high in fat and processed sugars, such as french fries, hamburgers, cookies, candies, and soda.    · A fiber supplement may be added to your diet if you cannot get enough fiber from foods.    · Drink enough fluids to keep your urine clear or pale yellow.    · Exercise regularly or as directed by your health care Erin Erin.    · Go to the restroom when you have the urge to go. Do not hold it.    · Only take over-the-counter or prescription medicines as directed by your health care Erin Erin. Do not take other medicines for constipation without talking to your health care Erin Erin first.    SEEK IMMEDIATE MEDICAL CARE IF:   · You have bright red blood in your stool.    · Your constipation lasts for more than 4 days or gets worse.    · You have abdominal or rectal pain.    · You have thin, pencil-like stools.    · You have unexplained weight loss.  MAKE SURE YOU:   · Understand these instructions.  · Will watch your condition.  · Will get help right away if you are not   doing well or get worse.  Document Released: 04/08/2004 Document Revised: 07/16/2013 Document Reviewed: 04/22/2013  ExitCare® Patient Information ©2015 [employer]. This information is not intended to replace advice given to you by your health care Erin Erin. Make sure you discuss any questions you have with your health care Erin Erin.

## 2014-01-17 LAB — GC/CHLAMYDIA PROBE AMP
CT PROBE, AMP APTIMA: NEGATIVE
GC Probe RNA: NEGATIVE

## 2014-02-18 ENCOUNTER — Ambulatory Visit: Payer: Managed Care, Other (non HMO) | Admitting: Family

## 2014-02-24 ENCOUNTER — Encounter: Payer: Self-pay | Admitting: Family

## 2014-02-24 ENCOUNTER — Ambulatory Visit (INDEPENDENT_AMBULATORY_CARE_PROVIDER_SITE_OTHER): Payer: Managed Care, Other (non HMO) | Admitting: Family

## 2014-02-24 VITALS — BP 126/88 | HR 71 | Temp 98.2°F | Resp 16 | Ht 64.0 in | Wt 185.0 lb

## 2014-02-24 DIAGNOSIS — Z8619 Personal history of other infectious and parasitic diseases: Secondary | ICD-10-CM

## 2014-02-24 DIAGNOSIS — N760 Acute vaginitis: Secondary | ICD-10-CM

## 2014-02-24 DIAGNOSIS — Z23 Encounter for immunization: Secondary | ICD-10-CM

## 2014-02-24 DIAGNOSIS — N898 Other specified noninflammatory disorders of vagina: Secondary | ICD-10-CM

## 2014-02-24 DIAGNOSIS — B009 Herpesviral infection, unspecified: Secondary | ICD-10-CM

## 2014-02-24 NOTE — Progress Notes (Signed)
Pre visit review using our clinic review tool, if applicable. No additional management support is needed unless otherwise documented below in the visit note. 

## 2014-02-24 NOTE — Progress Notes (Signed)
Subjective:    Patient ID: Erin English, female    DOB: 07/26/1985, 28 y.o.   MRN: [id number]  HPI  Ms. Erin English is a 28 yr old female who presents today to establish care. She would like to bring immunizations up to date. Reports that last tetanus was >10 yrs.    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  Genital warts 2008- was treated with aldara and resolved. Of note, she was evaluated in the Urgent care on 01/16/14 for dysuria.  Records are reviewed. She had neg GC/Chlamydia probe that day.  Wet prep was consistent with BV and she was treated with metronidazole. Reports earlier this week discharge was thick. Now thinner and brownish in color.  No otc measures. Occasional itching, not every day. No new.  Denies dysuria.  Occasional urinary leakage.  Reports that this happens after she has been taking abx.         Review of Systems  Constitutional: Negative for unexpected weight change.  HENT: Negative for hearing loss and rhinorrhea.   Eyes: Negative for visual disturbance.  Respiratory: Negative for cough.   Cardiovascular: Negative for chest pain.  Gastrointestinal: Negative for nausea, vomiting and diarrhea.  Musculoskeletal: Negative for arthralgias and myalgias.  Skin: Negative for rash.  Neurological: Negative for headaches.  Hematological: Negative for adenopathy.  Psychiatric/Behavioral:       Denies depression/anxiety   Past Medical History  Diagnosis Date  . Anemia   . History of chicken pox   . Genital warts   . Herpes     History   Social History  . Marital Status: Single    Spouse Name: N/A    Number of Children: N/A  . Years of Education: N/A   Occupational History  . Not on file.   Social History Main Topics  . Smoking status: Never Smoker   . Smokeless tobacco: Never Used  . Alcohol Use: 1.0 oz/week    2 drink(s) per week     Comment: social  . Drug Use: No  .  Sexual Activity: Not on file   Other Topics Concern  . Not on file   Social History Narrative   Single   Lives alone   Works in Programmer, systemsaccounting   Bachelors degree   Enjoys sleeping, sewing, cooking   No pets, non-smoker    Past Surgical History  Procedure Laterality Date  . Cholecystectomy  2008    Family History  Problem Relation Age of Onset  . Hypertension Father   . Cancer Paternal Grandmother     breast  . Cancer Paternal Grandfather     prostate    No Known Allergies  Current Outpatient Prescriptions on File Prior to Visit  Medication Sig Dispense Refill  . naproxen sodium (ANAPROX DS) 550 MG tablet Take 1 tablet (550 mg total) by mouth 2 (two) times daily with a meal.  40 tablet  0  . norethindrone-ethinyl estradiol (NECON,BREVICON,MODICON) 0.5-35 MG-MCG tablet Take 1 tablet by mouth daily.      . phentermine (ADIPEX-P) 37.5 MG tablet Take 37.5 mg by mouth daily before breakfast.       No current facility-administered medications on file prior to visit.    BP 126/88  Pulse 71  Temp(Src) 98.2 F (36.8 C) (Oral)  Resp 16  Ht 5\' 4"  (1.626 m)  Wt 185 lb (83.915 kg)  BMI 31.74 kg/m2  SpO2 99%  LMP 02/02/2014  Objective:   Physical Exam  Constitutional: She is oriented to person, place, and time. She appears well-developed and well-nourished.  HENT:  Head: Normocephalic and atraumatic.  Eyes: Conjunctivae are normal.  Genitourinary: Vagina normal. There is no rash on the right labia. There is no rash on the left labia.  Musculoskeletal: She exhibits no edema.  Neurological: She is alert and oriented to person, place, and time.  Psychiatric: She has a normal mood and affect. Her behavior is normal. Judgment and thought content normal.          Assessment & Plan:

## 2014-02-24 NOTE — Assessment & Plan Note (Signed)
Resolved

## 2014-02-24 NOTE — Assessment & Plan Note (Signed)
Wet prep performed today 

## 2014-02-24 NOTE — Assessment & Plan Note (Signed)
Asymptomatic. We discussed that she can transmit to partner even in the absence of symptoms.

## 2014-02-24 NOTE — Patient Instructions (Signed)
We will contact you with your results from today's test. Please schedule a fasting physical at the front desk.  Welcome to [facility]!

## 2014-02-25 ENCOUNTER — Telehealth: Payer: Self-pay | Admitting: *Deleted

## 2014-02-25 NOTE — Telephone Encounter (Signed)
Pt left message requesting wet prep results from last night. Pt states she will be flying out of town in the morning and will be returning on Saturday.  Advised pt that results are not final yet and if Rxs are needed we can send them to wherever she will be. Pt voices understanding. Awaiting results.

## 2014-02-26 LAB — WET PREP BY MOLECULAR PROBE
Candida species: POSITIVE — AB
Gardnerella vaginalis: NEGATIVE
TRICHOMONAS VAG: NEGATIVE

## 2014-02-26 NOTE — Telephone Encounter (Signed)
Left message for pt to return my call and have me paged. 

## 2014-02-26 NOTE — Telephone Encounter (Signed)
Wet prep shows yeast.  I have pended rx for diflucan to be sent to pharmacy of her choice please.

## 2014-02-28 MED ORDER — FLUCONAZOLE 150 MG PO TABS: ORAL_TABLET | ORAL | Status: DC

## 2014-02-28 NOTE — Telephone Encounter (Signed)
Pt returned my call and was notified of below results. Rx sent to [facility] [place] at pt's request.

## 2014-03-21 ENCOUNTER — Telehealth: Payer: Self-pay | Admitting: Family

## 2014-03-21 NOTE — Telephone Encounter (Signed)
Received medical records from [facility] °

## 2014-04-09 ENCOUNTER — Ambulatory Visit: Payer: Managed Care, Other (non HMO) | Admitting: Family

## 2014-04-11 ENCOUNTER — Encounter: Payer: Self-pay | Admitting: Family

## 2014-04-11 ENCOUNTER — Ambulatory Visit (INDEPENDENT_AMBULATORY_CARE_PROVIDER_SITE_OTHER): Payer: Managed Care, Other (non HMO) | Admitting: Family

## 2014-04-11 VITALS — BP 112/77 | HR 77 | Temp 98.3°F | Resp 16 | Ht 64.0 in | Wt 179.2 lb

## 2014-04-11 DIAGNOSIS — N898 Other specified noninflammatory disorders of vagina: Secondary | ICD-10-CM

## 2014-04-11 DIAGNOSIS — N760 Acute vaginitis: Secondary | ICD-10-CM

## 2014-04-11 NOTE — Progress Notes (Signed)
   Subjective:    Patient ID: Erin English, female    DOB: 06-05-1986, 28 y.o.   MRN: [id number]  HPI  [name] is a 28 yr old female who presents today with chief complaint of vaginal discharge. She was treated for yeast infection back in early August.  Notes thin white discharge and irritation after intercourse and the following day. Started on 04/10/14. Denies associated fever or abdominal pain. Denies new partners.      Review of Systems    see HPI  Past Medical History  Diagnosis Date  . Anemia   . History of chicken pox   . Genital warts   . Herpes     History   Social History  . Marital Status: Single    Spouse Name: N/A    Number of Children: N/A  . Years of Education: N/A   Occupational History  . Not on file.   Social History Main Topics  . Smoking status: Never Smoker   . Smokeless tobacco: Never Used  . Alcohol Use: 1.0 oz/week    2 drink(s) per week     Comment: social  . Drug Use: No  . Sexual Activity: Not on file   Other Topics Concern  . Not on file   Social History Narrative   Single   Lives alone   Works in Programmer, systems degree   Enjoys sleeping, sewing, cooking   No pets, non-smoker    Past Surgical History  Procedure Laterality Date  . Cholecystectomy  2008    Family History  Problem Relation Age of Onset  . Hypertension Father   . Cancer Paternal Grandmother     breast  . Cancer Paternal Grandfather     prostate    No Known Allergies  Current Outpatient Prescriptions on File Prior to Visit  Medication Sig Dispense Refill  . naproxen sodium (ANAPROX DS) 550 MG tablet Take 1 tablet (550 mg total) by mouth 2 (two) times daily with a meal.  40 tablet  0  . norethindrone-ethinyl estradiol (NECON,BREVICON,MODICON) 0.5-35 MG-MCG tablet Take 1 tablet by mouth daily.      . phentermine (ADIPEX-P) 37.5 MG tablet Take 37.5 mg by mouth daily before breakfast.      . Prenatal Vit-Fe Fumarate-FA (MULTIVITAMIN-PRENATAL)  27-0.8 MG TABS tablet Take 1 tablet by mouth daily at 12 noon.       No current facility-administered medications on file prior to visit.    BP 112/77  Pulse 77  Temp(Src) 98.3 F (36.8 C) (Oral)  Resp 16  Ht  (1.626 m)  Wt 179 lb 3.2 oz (81.285 kg)  BMI 30.74 kg/m2  SpO2 99%  LMP 03/28/2014    Objective:   Physical Exam  Constitutional: She is oriented to person, place, and time. She appears well-developed and well-nourished. No distress.  Cardiovascular: Normal rate and regular rhythm.   No murmur heard. Pulmonary/Chest: Effort normal and breath sounds normal. No respiratory distress. She has no wheezes. She has no rales. She exhibits no tenderness.  Genitourinary: Vagina normal. There is no rash on the right labia. There is no rash on the left labia.  Small amount of cervical discharge noted  Musculoskeletal: She exhibits no edema.  Neurological: She is alert and oriented to person, place, and time.  Psychiatric: She has a normal mood and affect. Her behavior is normal. Judgment and thought content normal.          Assessment & Plan:

## 2014-04-11 NOTE — Patient Instructions (Signed)
We will contact you with your results.  

## 2014-04-11 NOTE — Progress Notes (Signed)
Pre visit review using our clinic review tool, if applicable. No additional management support is needed unless otherwise documented below in the visit note. 

## 2014-04-11 NOTE — Assessment & Plan Note (Signed)
Wet prep and GC/Chlamydia screen obtained today. Will treat pending results review.

## 2014-04-12 LAB — GC/CHLAMYDIA PROBE AMP
CT PROBE, AMP APTIMA: NEGATIVE
GC PROBE AMP APTIMA: NEGATIVE

## 2014-04-12 LAB — WET PREP BY MOLECULAR PROBE
Candida species: NEGATIVE
GARDNERELLA VAGINALIS: NEGATIVE
TRICHOMONAS VAG: NEGATIVE

## 2014-04-13 ENCOUNTER — Encounter: Payer: Self-pay | Admitting: Family

## 2014-05-16 ENCOUNTER — Ambulatory Visit (INDEPENDENT_AMBULATORY_CARE_PROVIDER_SITE_OTHER): Payer: Managed Care, Other (non HMO) | Admitting: Physician Assistant

## 2014-05-16 ENCOUNTER — Encounter: Payer: Self-pay | Admitting: Physician Assistant

## 2014-05-16 VITALS — BP 119/82 | HR 63 | Temp 98.3°F | Resp 16 | Ht 64.0 in | Wt 177.4 lb

## 2014-05-16 DIAGNOSIS — M67431 Ganglion, right wrist: Secondary | ICD-10-CM

## 2014-05-16 NOTE — Patient Instructions (Signed)

## 2014-05-16 NOTE — Progress Notes (Signed)
Pre visit review using our clinic review tool, if applicable. No additional management support is needed unless otherwise documented below in the visit note/SLS  

## 2014-05-20 DIAGNOSIS — M67439 Ganglion, unspecified wrist: Secondary | ICD-10-CM | POA: Insufficient documentation

## 2014-05-20 NOTE — Assessment & Plan Note (Signed)
Discussed benign nature of cyst and prognosis.  Discussed conservative measures to include splinting and NSAIDs.  Patient to monitor cyst for changes or development of pain.  If pain occurs, recommend she see a Development worker, international aidgeneral surgeon.  Handout given.

## 2014-05-20 NOTE — Progress Notes (Signed)
Patient presents to clinic today c/o cyst on the dorsal aspect of her right wrist x 1 day.  Denies trauma or injury.  "Cyst" is non painful.  Denies decreased ROM, numbness or tingling of RUE.  Is right-handed.   Past Medical History  Diagnosis Date  . Anemia   . History of chicken pox   . Genital warts   . Herpes     Current Outpatient Prescriptions on File Prior to Visit  Medication Sig Dispense Refill  . naproxen sodium (ANAPROX DS) 550 MG tablet Take 1 tablet (550 mg total) by mouth 2 (two) times daily with a meal.  40 tablet  0  . norethindrone-ethinyl estradiol (NECON,BREVICON,MODICON) 0.5-35 MG-MCG tablet Take 1 tablet by mouth daily.      . Prenatal Vit-Fe Fumarate-FA (MULTIVITAMIN-PRENATAL) 27-0.8 MG TABS tablet Take 1 tablet by mouth daily at 12 noon.      . phentermine (ADIPEX-P) 37.5 MG tablet Take 37.5 mg by mouth daily before breakfast.       No current facility-administered medications on file prior to visit.    No Known Allergies  Family History  Problem Relation Age of Onset  . Hypertension Father   . Cancer Paternal Grandmother     breast  . Cancer Paternal Grandfather     prostate    History   Social History  . Marital Status: Single    Spouse Name: N/A    Number of Children: N/A  . Years of Education: N/A   Social History Main Topics  . Smoking status: Never Smoker   . Smokeless tobacco: Never Used  . Alcohol Use: 1.0 oz/week    2 drink(s) per week     Comment: social  . Drug Use: No  . Sexual Activity: None   Other Topics Concern  . None   Social History Narrative   Single   Lives alone   Works in Programmer, systemsaccounting   Bachelors degree   Enjoys sleeping, sewing, cooking   No pets, non-smoker   Review of Systems - See HPI.  All other ROS are negative.  BP 119/82  Pulse 63  Temp(Src) 98.3 F (36.8 C) (Oral)  Resp 16  Ht 5\' 4"  (1.626 m)  Wt 177 lb 6 oz (80.457 kg)  BMI 30.43 kg/m2  SpO2 100%  LMP 05/02/2014  Physical Exam  Vitals  reviewed. Constitutional: She is oriented to person, place, and time and well-developed, well-nourished, and in no distress.  HENT:  Head: Normocephalic and atraumatic.  Eyes: Conjunctivae are normal.  Cardiovascular: Normal rate, regular rhythm, normal heart sounds and intact distal pulses.   Pulmonary/Chest: Effort normal and breath sounds normal. No respiratory distress. She has no wheezes. She has no rales. She exhibits no tenderness.  Neurological: She is alert and oriented to person, place, and time.  Skin: Skin is warm and dry. No rash noted.  Presence of a 1 cm ganglion cyst of R dorsal wrist.  ROM within normal limits.  Extremity is neurovascularly intact.  Psychiatric: Affect normal.    Recent Results (from the past 2160 hour(s))  WET PREP BY MOLECULAR PROBE     Status: Abnormal   Collection Time    02/24/14  7:37 PM      Result Value Ref Range   Candida species POS (*) Negative   Trichomonas vaginosis NEG  Negative   Gardnerella vaginalis NEG  Negative  WET PREP BY MOLECULAR PROBE     Status: None   Collection Time  04/11/14  8:36 AM      Result Value Ref Range   Candida species NEG  Negative   Trichomonas vaginosis NEG  Negative   Gardnerella vaginalis NEG  Negative  GC/CHLAMYDIA PROBE AMP     Status: None   Collection Time    04/11/14  8:36 AM      Result Value Ref Range   CT Probe RNA NEGATIVE     GC Probe RNA NEGATIVE     Comment:                                                                                             **Normal Reference Range: Negative**                 Assay performed using the Gen-Probe APTIMA COMBO2 (R) Assay.           Acceptable specimen types for this assay include APTIMA Swabs (Unisex,     endocervical, urethral, or vaginal), first void urine, and ThinPrep     liquid based cytology samples.    Assessment/Plan: Ganglion cyst of wrist Discussed benign nature of cyst and prognosis.  Discussed conservative measures to include  splinting and NSAIDs.  Patient to monitor cyst for changes or development of pain.  If pain occurs, recommend she see a Development worker, international aidgeneral surgeon.  Handout given.

## 2014-10-01 ENCOUNTER — Ambulatory Visit (INDEPENDENT_AMBULATORY_CARE_PROVIDER_SITE_OTHER): Payer: Managed Care, Other (non HMO) | Admitting: Family

## 2014-10-01 ENCOUNTER — Encounter: Payer: Self-pay | Admitting: Family

## 2014-10-01 VITALS — BP 110/80 | HR 99 | Temp 98.1°F | Resp 16 | Ht 64.0 in | Wt 200.0 lb

## 2014-10-01 DIAGNOSIS — Z0000 Encounter for general adult medical examination without abnormal findings: Secondary | ICD-10-CM

## 2014-10-01 LAB — CBC WITH DIFFERENTIAL/PLATELET
Basophils Absolute: 0 10*3/uL (ref 0.0–0.1)
Basophils Relative: 0.2 % (ref 0.0–3.0)
EOS ABS: 0.1 10*3/uL (ref 0.0–0.7)
Eosinophils Relative: 1.1 % (ref 0.0–5.0)
HCT: 41.4 % (ref 36.0–46.0)
Hemoglobin: 14.2 g/dL (ref 12.0–15.0)
Lymphocytes Relative: 32.3 % (ref 12.0–46.0)
Lymphs Abs: 2.4 10*3/uL (ref 0.7–4.0)
MCHC: 34.3 g/dL (ref 30.0–36.0)
MCV: 92.7 fl (ref 78.0–100.0)
Monocytes Absolute: 0.4 10*3/uL (ref 0.1–1.0)
Monocytes Relative: 4.9 % (ref 3.0–12.0)
NEUTROS ABS: 4.5 10*3/uL (ref 1.4–7.7)
Neutrophils Relative %: 61.5 % (ref 43.0–77.0)
PLATELETS: 274 10*3/uL (ref 150.0–400.0)
RBC: 4.46 Mil/uL (ref 3.87–5.11)
RDW: 13.1 % (ref 11.5–15.5)
WBC: 7.3 10*3/uL (ref 4.0–10.5)

## 2014-10-01 LAB — LIPID PANEL
Cholesterol: 176 mg/dL (ref 0–200)
HDL: 53.2 mg/dL (ref >39.00)
LDL CALC: 96 mg/dL (ref 0–99)
NONHDL: 122.8
Total CHOL/HDL Ratio: 3
Triglycerides: 134 mg/dL (ref 0.0–149.0)
VLDL: 26.8 mg/dL (ref 0.0–40.0)

## 2014-10-01 LAB — URINALYSIS, ROUTINE W REFLEX MICROSCOPIC
BILIRUBIN URINE: NEGATIVE
Hgb urine dipstick: NEGATIVE
Ketones, ur: NEGATIVE
LEUKOCYTES UA: NEGATIVE
Nitrite: NEGATIVE
RBC / HPF: NONE SEEN (ref 0–?)
Specific Gravity, Urine: 1.02 (ref 1.000–1.030)
Total Protein, Urine: NEGATIVE
Urine Glucose: NEGATIVE
Urobilinogen, UA: 0.2 (ref 0.0–1.0)
pH: 6.5 (ref 5.0–8.0)

## 2014-10-01 LAB — BASIC METABOLIC PANEL
BUN: 12 mg/dL (ref 6–23)
CHLORIDE: 104 meq/L (ref 96–112)
CO2: 25 meq/L (ref 19–32)
Calcium: 9.5 mg/dL (ref 8.4–10.5)
Creatinine, Ser: 0.81 mg/dL (ref 0.40–1.20)
GFR: 106.58 mL/min (ref >60.00)
Glucose, Bld: 102 mg/dL — ABNORMAL HIGH (ref 70–99)
POTASSIUM: 4 meq/L (ref 3.5–5.1)
SODIUM: 135 meq/L (ref 135–145)

## 2014-10-01 LAB — TSH: TSH: 1.31 u[IU]/mL (ref 0.35–4.50)

## 2014-10-01 LAB — HEPATIC FUNCTION PANEL
ALT: 20 U/L (ref 0–35)
AST: 21 U/L (ref 0–37)
Albumin: 3.9 g/dL (ref 3.5–5.2)
Alkaline Phosphatase: 37 U/L — ABNORMAL LOW (ref 39–117)
BILIRUBIN TOTAL: 0.3 mg/dL (ref 0.2–1.2)
Bilirubin, Direct: 0.1 mg/dL (ref 0.0–0.3)
Total Protein: 7.4 g/dL (ref 6.0–8.3)

## 2014-10-01 MED ORDER — NORETHINDRONE-ETH ESTRADIOL 0.5-35 MG-MCG PO TABS: 1.0000 | ORAL_TABLET | Freq: Every day | ORAL | Status: DC

## 2014-10-01 NOTE — Patient Instructions (Signed)
Please complete lab work prior to leaving. Try to count calories using my fitness pal with a goal weight loss of 1-2 pounds per week. Please continue your regular exercise. Follow up in 1 year.

## 2014-10-01 NOTE — Progress Notes (Signed)
Subjective:    Patient ID: Erin English, female    DOB: [birth year], 29 y.o.   MRN: [id number]  HPI  Patient presents today for complete physical.  Immunizations: up to date,  Diet: eats fruits/veggies/junk food Exercise: 3 days a week 45 min (elliptical) Pap Smear: 2015- normal    Review of Systems  Constitutional: Negative for unexpected weight change.  HENT: Positive for rhinorrhea. Negative for hearing loss.   Eyes: Negative for visual disturbance.  Respiratory: Negative for cough.   Cardiovascular: Negative for leg swelling.  Gastrointestinal: Negative for nausea, diarrhea and constipation.  Genitourinary: Negative for dysuria and frequency.  Musculoskeletal: Negative for myalgias.       Had neck pain which was bothering her 3 weeks ago. Now improved after chiropractor.   Skin: Negative for rash.  Neurological: Negative for headaches.  Hematological: Negative for adenopathy.  Psychiatric/Behavioral: Negative for dysphoric mood and agitation.   Past Medical History  Diagnosis Date  . Anemia   . History of chicken pox   . Genital warts   . Herpes     History   Social History  . Marital Status: Single    Spouse Name: N/A  . Number of Children: N/A  . Years of Education: N/A   Occupational History  . Not on file.   Social History Main Topics  . Smoking status: Never Smoker   . Smokeless tobacco: Never Used  . Alcohol Use: 1.0 oz/week    2 drink(s) per week     Comment: social  . Drug Use: No  . Sexual Activity: Not on file   Other Topics Concern  . Not on file   Social History Narrative   Single   Lives alone   Works in Programmer, systems degree   Enjoys sleeping, sewing, cooking   No pets, non-smoker    Past Surgical History  Procedure Laterality Date  . Cholecystectomy  2008    Family History  Problem Relation Age of Onset  . Hypertension Father   . Cancer Paternal Grandmother     breast  . Cancer Paternal Grandfather    prostate    No Known Allergies  Current Outpatient Prescriptions on File Prior to Visit  Medication Sig Dispense Refill  . norethindrone-ethinyl estradiol (NECON,BREVICON,MODICON) 0.5-35 MG-MCG tablet Take 1 tablet by mouth daily.    . phentermine (ADIPEX-P) 37.5 MG tablet Take 37.5 mg by mouth daily before breakfast.    . Prenatal Vit-Fe Fumarate-FA (MULTIVITAMIN-PRENATAL) 27-0.8 MG TABS tablet Take 1 tablet by mouth daily at 12 noon.     No current facility-administered medications on file prior to visit.    BP 110/80 mmHg  Pulse 99  Temp(Src) 98.1 F (36.7 C) (Oral)  Resp 16  Ht  (1.626 m)  Wt 200 lb (90.719 kg)  BMI 34.31 kg/m2  SpO2 99%  LMP 09/10/2014       Objective:   Physical Exam  Physical Exam  Constitutional: She is oriented to person, place, and time. She appears well-developed and well-nourished. No distress.  HENT:  Head: Normocephalic and atraumatic.  Right Ear: Tympanic membrane and ear canal normal.  Left Ear: Tympanic membrane and ear canal normal.  Mouth/Throat: Oropharynx is clear and moist.  Eyes: Pupils are equal, round, and reactive to light. No scleral icterus.  Neck: Normal range of motion. No thyromegaly present.  Cardiovascular: Normal rate and regular rhythm.   No murmur heard. Pulmonary/Chest: Effort normal and breath sounds normal. No respiratory distress.  He has no wheezes. She has no rales. She exhibits no tenderness.  Abdominal: Soft. Bowel sounds are normal. He exhibits no distension and no mass. There is no tenderness. There is no rebound and no guarding.  Musculoskeletal: She exhibits no edema.  Lymphadenopathy:    She has no cervical adenopathy.  Neurological: She is alert and oriented to person, place, and time. She has normal patellar reflexes. She exhibits normal muscle tone. Coordination normal.  Skin: Skin is warm and dry.  Psychiatric: She has a normal mood and affect. Her behavior is normal. Judgment and thought  content normal.  Breasts: Examined lying Right: Without masses, retractions, discharge or axillary adenopathy.  Left: Without masses, retractions, discharge or axillary adenopathy.           Assessment & Plan:         Assessment & Plan:

## 2014-10-01 NOTE — Progress Notes (Signed)
Pre visit review using our clinic review tool, if applicable. No additional management support is needed unless otherwise documented below in the visit note. 

## 2014-10-02 DIAGNOSIS — Z0000 Encounter for general adult medical examination without abnormal findings: Secondary | ICD-10-CM | POA: Insufficient documentation

## 2014-10-02 LAB — HIV ANTIBODY (ROUTINE TESTING W REFLEX): HIV 1&2 Ab, 4th Generation: NONREACTIVE

## 2014-10-02 NOTE — Assessment & Plan Note (Signed)
Discussed counting calories and increasing exercise to promote weight loss. Due to pregnancy category, would avoid qysmia.  Pap, immunizations up to date.  Obtain routine lab work.

## 2014-10-06 ENCOUNTER — Other Ambulatory Visit (INDEPENDENT_AMBULATORY_CARE_PROVIDER_SITE_OTHER): Payer: Managed Care, Other (non HMO)

## 2014-10-06 ENCOUNTER — Encounter: Payer: Self-pay | Admitting: Family

## 2014-10-06 DIAGNOSIS — D62 Acute posthemorrhagic anemia: Secondary | ICD-10-CM

## 2014-10-06 LAB — HEMOGLOBIN A1C: Hgb A1c MFr Bld: 5.5 % (ref 4.6–6.5)

## 2014-10-07 ENCOUNTER — Encounter: Payer: Self-pay | Admitting: Family

## 2014-10-07 DIAGNOSIS — R739 Hyperglycemia, unspecified: Secondary | ICD-10-CM | POA: Insufficient documentation

## 2014-10-10 ENCOUNTER — Telehealth: Payer: Self-pay | Admitting: Family

## 2014-10-10 MED ORDER — NORETHINDRONE-ETH ESTRADIOL 1-35 MG-MCG PO TABS: 1.0000 | ORAL_TABLET | Freq: Every day | ORAL | Status: DC

## 2014-10-10 NOTE — Telephone Encounter (Signed)
I sent corrected dose to her pharmacy.

## 2014-10-10 NOTE — Telephone Encounter (Signed)
Caller name: Aeliana Relation to pt: self Call back number: [phone] Pharmacy: [facility] on [address]  Reason for call:   Patient states that we sent in wrong dosage of norethindrone-ethinyl estradiol (NECON,BREVICON,MODICON) 0.5-35 MG-MCG tablet [[id number]][[id number]]  To the pharmacy. She states that she uses 1-35 mg-mcg tablet. Patient is needing this corrected today.

## 2014-10-17 IMAGING — CR DG ANKLE COMPLETE 3+V*L*
3 series · 3 of 3 positions shown · non-contrast
Comparison: None.

CLINICAL DATA: Pain and swelling medial left ankle.

EXAM:
LEFT ANKLE COMPLETE - 3+ VIEW

[AP]
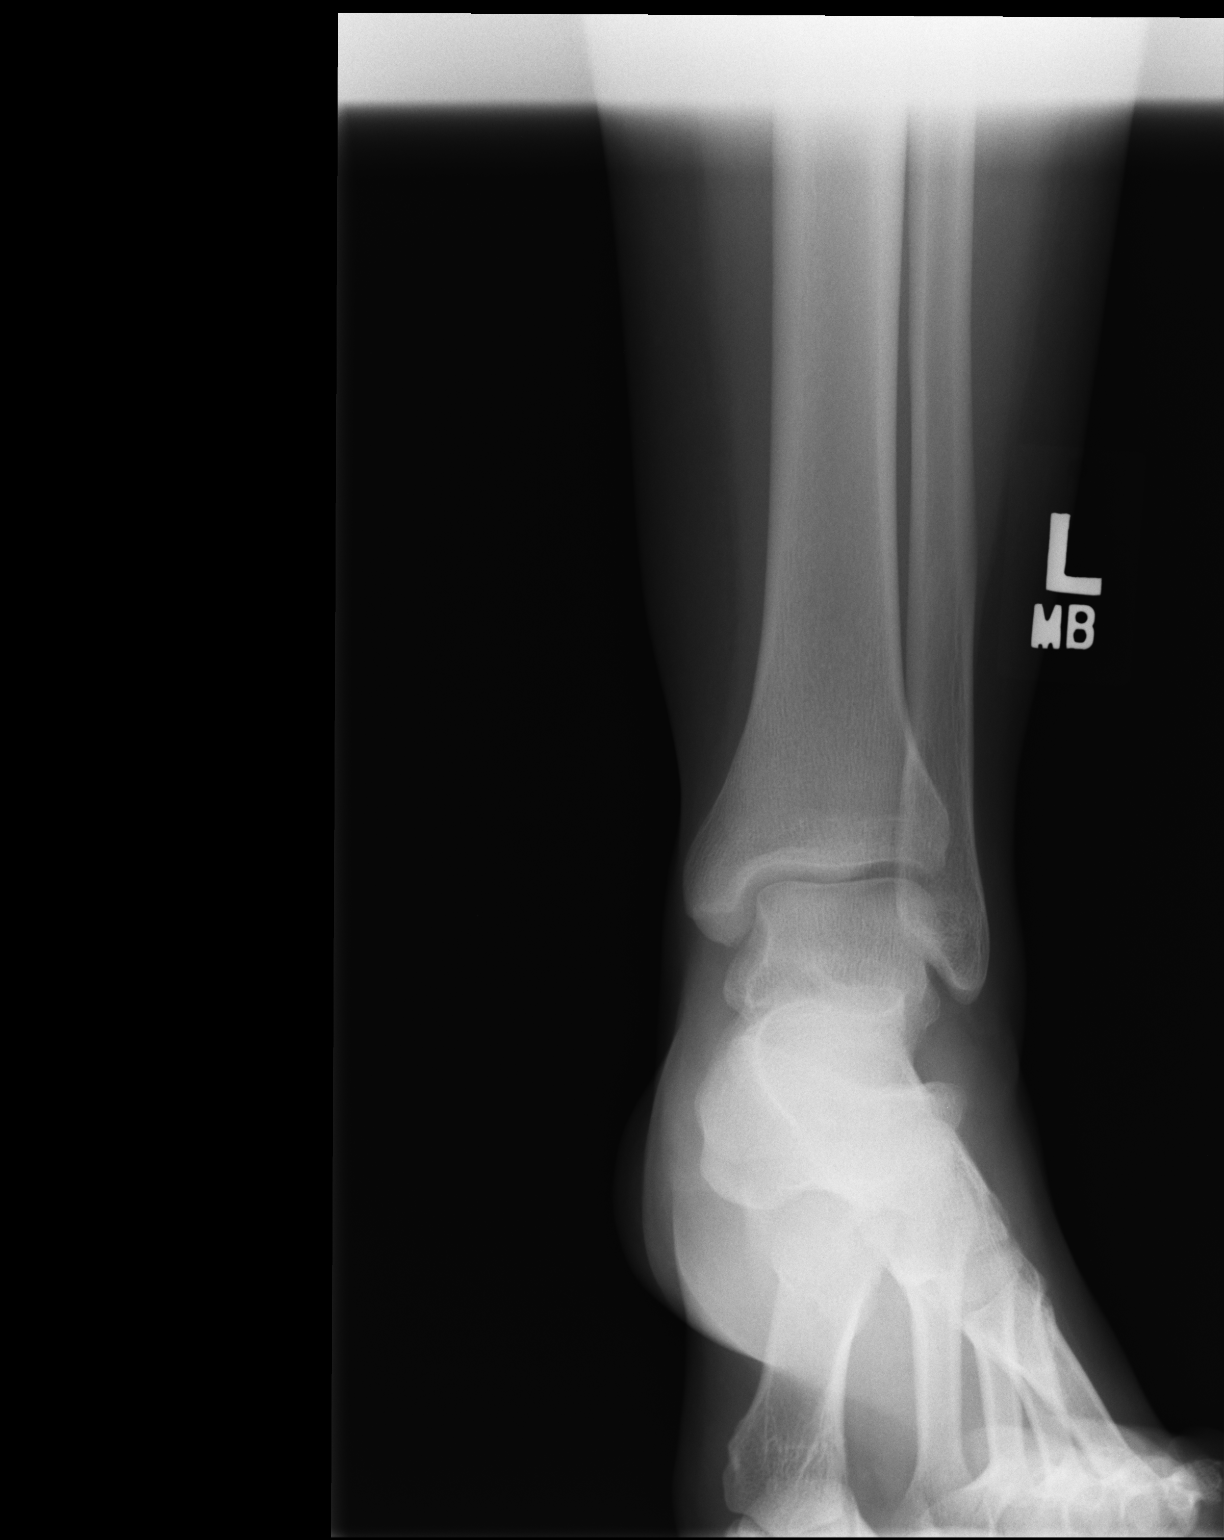

[ap obl int rot]
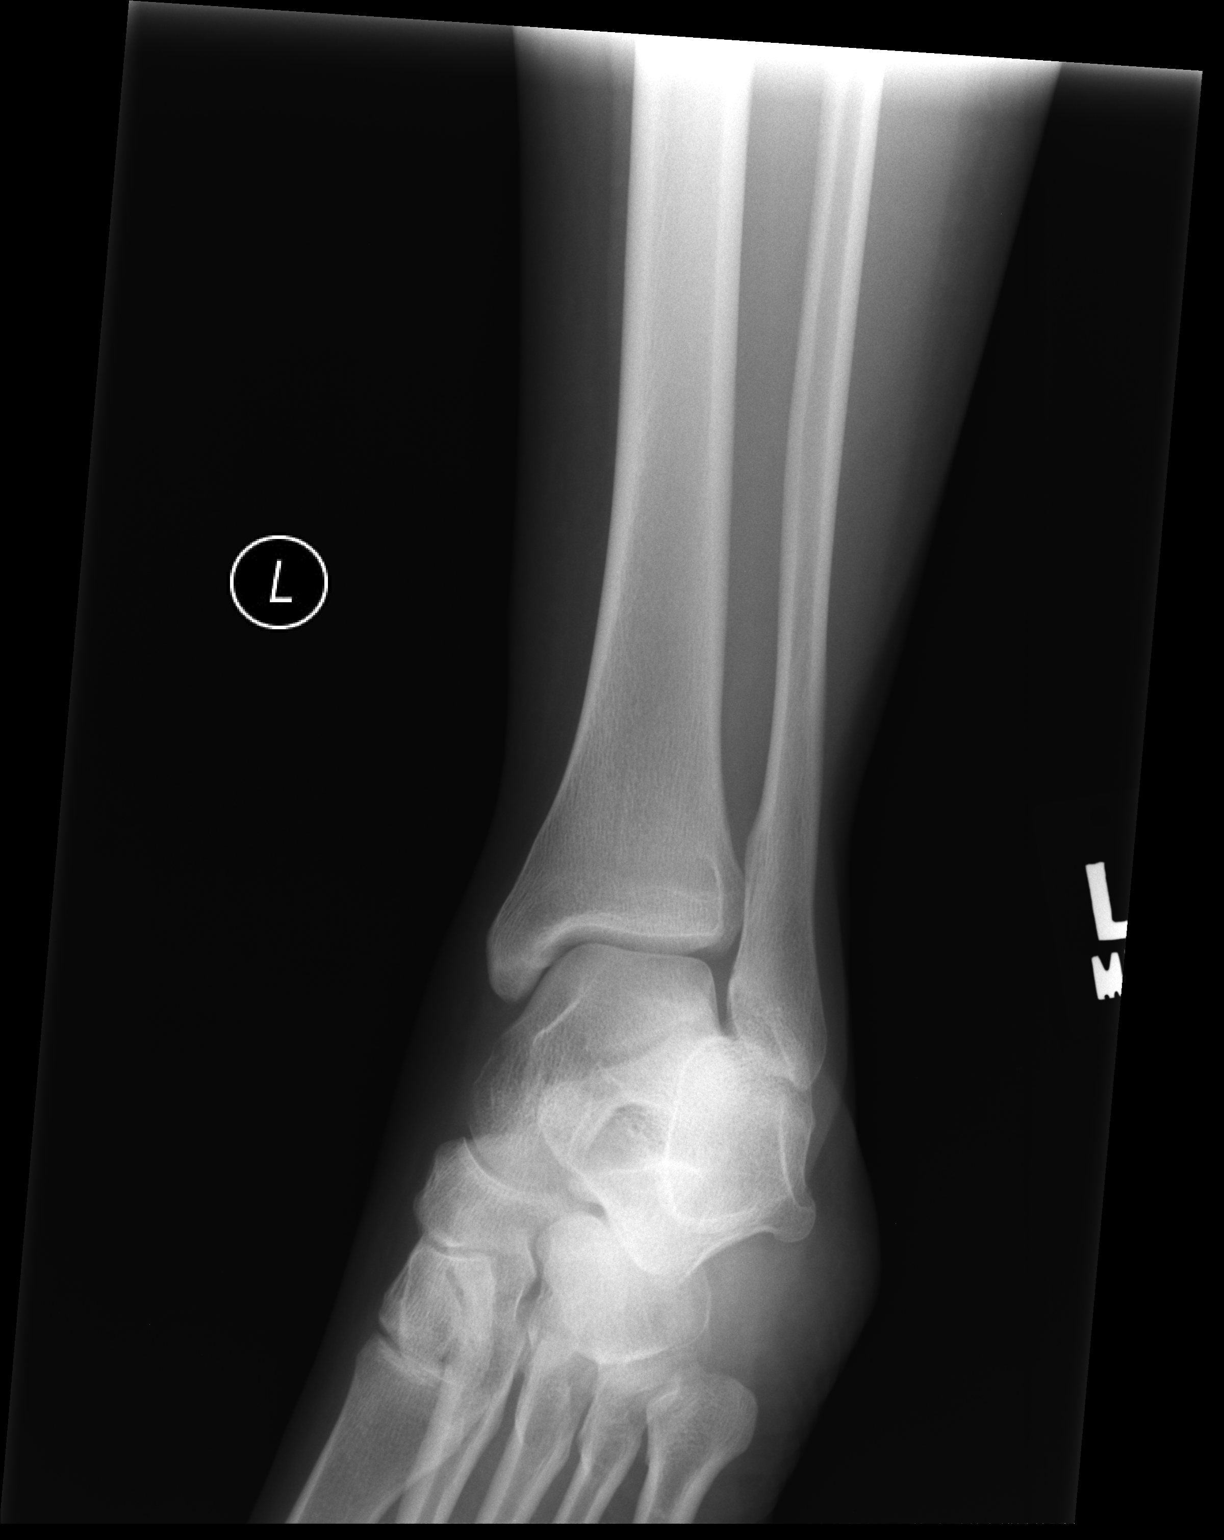

[lateral]
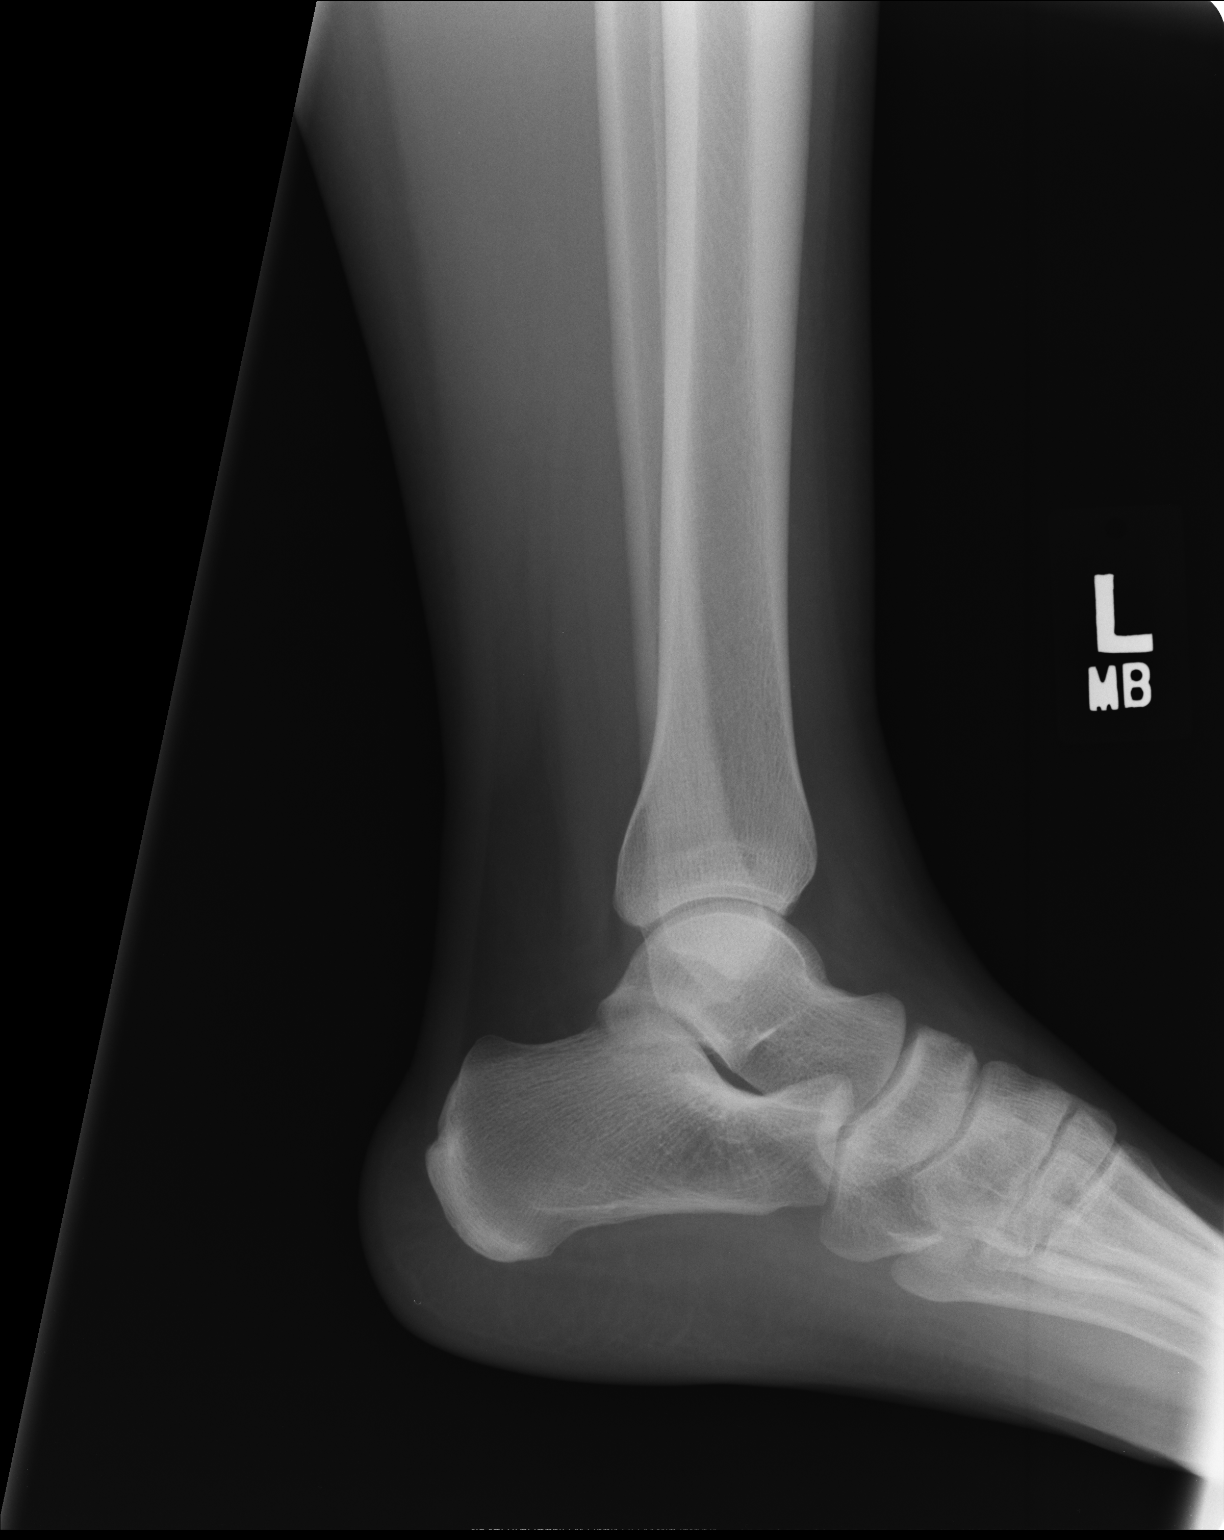

[3 of 3 positions shown; findings below may reference images not displayed]

FINDINGS: Imaged bones, joints and soft tissues appear normal.
IMPRESSION: Negative exam.

## 2014-12-05 ENCOUNTER — Telehealth: Payer: Self-pay | Admitting: Family

## 2014-12-05 NOTE — Telephone Encounter (Signed)
Spoke with pt and advised her BMI was calculated at her cpe and she can fax form to my attn for us to complete. Pt will include her waist circumference. Awaiting form.

## 2014-12-05 NOTE — Telephone Encounter (Signed)
Caller name: Makayleigh Relation to pt: self Call back number: [phone] Pharmacy:  Reason for call:   Patient states that her insurance is requiring that she has a wellness exam. Patient states that she had her physical back in march. Not sure if BMI was checked at that visit?? Patient is wanting to know if she needs appointment to have BMI checked or can she just drop form off to be completed

## 2014-12-08 NOTE — Telephone Encounter (Signed)
Form received and forwarded to Provider for signature. Sent mychart message to pt that we will notify her when it is ready for pick up.

## 2014-12-08 NOTE — Telephone Encounter (Signed)
Pt called to see if form was received. She states it was faxed Friday. Please call her back  At [phone] (cell #).

## 2014-12-09 NOTE — Telephone Encounter (Signed)
Form completed, and placed at front desk for pick up. Copy sent to scan. Sent mychart message to pt.

## 2015-02-16 ENCOUNTER — Other Ambulatory Visit (HOSPITAL_COMMUNITY)
Admission: RE | Admit: 2015-02-16 | Discharge: 2015-02-16 | Disposition: A | Discharge status: Home / Self Care | Payer: Managed Care, Other (non HMO) | Source: Ambulatory Visit | Attending: Family | Admitting: Family

## 2015-02-16 ENCOUNTER — Encounter: Payer: Self-pay | Admitting: Family

## 2015-02-16 ENCOUNTER — Ambulatory Visit (INDEPENDENT_AMBULATORY_CARE_PROVIDER_SITE_OTHER): Payer: Managed Care, Other (non HMO) | Admitting: Family

## 2015-02-16 VITALS — BP 116/78 | HR 98 | Temp 98.7°F | Resp 18 | Ht 64.0 in | Wt 198.4 lb

## 2015-02-16 DIAGNOSIS — N760 Acute vaginitis: Secondary | ICD-10-CM

## 2015-02-16 DIAGNOSIS — Z116 Encounter for screening for other protozoal diseases and helminthiases: Secondary | ICD-10-CM | POA: Diagnosis present

## 2015-02-16 NOTE — Progress Notes (Signed)
   Subjective:    Patient ID: Erin English, female    DOB: [birth year], 29 y.o.   MRN: [id number]  HPI  Erin English is a 29 yr old female who presents today requesting STD/vaginitis screening. + vaginal odor.  Denies new partners, denies discharge.    Wt Readings from Last 3 Encounters:  02/16/15 198 lb 6.4 oz (89.994 kg)  10/01/14 200 lb (90.719 kg)  05/16/14 177 lb 6 oz (80.457 kg)     Review of Systems See HPI  Past Medical History  Diagnosis Date  . Anemia   . History of chicken pox   . Genital warts   . Herpes     History   Social History  . Marital Status: Single    Spouse Name: N/A  . Number of Children: N/A  . Years of Education: N/A   Occupational History  . Not on file.   Social History Main Topics  . Smoking status: Never Smoker   . Smokeless tobacco: Never Used  . Alcohol Use: 1.0 oz/week    2 drink(s) per week     Comment: social  . Drug Use: No  . Sexual Activity: Not on file   Other Topics Concern  . Not on file   Social History Narrative   Single   Lives alone   Works in Programmer, systems degree   Enjoys sleeping, sewing, cooking   No pets, non-smoker    Past Surgical History  Procedure Laterality Date  . Cholecystectomy  2008    Family History  Problem Relation Age of Onset  . Hypertension Father   . Cancer Paternal Grandmother     breast  . Cancer Paternal Grandfather     prostate    No Known Allergies  Current Outpatient Prescriptions on File Prior to Visit  Medication Sig Dispense Refill  . norethindrone-ethinyl estradiol 1/35 (ORTHO-NOVUM, NORTREL,CYCLAFEM) tablet Take 1 tablet by mouth daily. 1 Package 11  . Prenatal Vit-Fe Fumarate-FA (MULTIVITAMIN-PRENATAL) 27-0.8 MG TABS tablet Take 1 tablet by mouth daily at 12 noon.     No current facility-administered medications on file prior to visit.    BP 116/78 mmHg  Pulse 98  Temp(Src) 98.7 F (37.1 C) (Oral)  Resp 18  Ht  (1.626 m)  Wt 198 lb 6.4 oz  (89.994 kg)  BMI 34.04 kg/m2  SpO2 99%  LMP 02/01/2015       Objective:   Physical Exam  Constitutional: She appears well-developed and well-nourished.  Cardiovascular: Normal rate, regular rhythm and normal heart sounds.   No murmur heard. Pulmonary/Chest: Effort normal and breath sounds normal. No respiratory distress. She has no wheezes.  Genitourinary:  Thick white vaginal discharge noted. No appreciable odor  Psychiatric: She has a normal mood and affect. Her behavior is normal. Judgment and thought content normal.          Assessment & Plan:

## 2015-02-16 NOTE — Assessment & Plan Note (Signed)
Suspect candidiasis.  Obtained wet prep and GC/Chlamydia probe.  Await results and plan to treat accordingly.

## 2015-02-16 NOTE — Progress Notes (Signed)
Pre visit review using our clinic review tool, if applicable. No additional management support is needed unless otherwise documented below in the visit note. 

## 2015-02-16 NOTE — Patient Instructions (Signed)
We will contact you with your results and further recommendations.  °

## 2015-02-16 NOTE — Addendum Note (Signed)
Addended by: Neldon Labella on: 02/16/2015 12:05 PM   Modules accepted: Orders

## 2015-02-17 LAB — CERVICOVAGINAL ANCILLARY ONLY
Chlamydia: NEGATIVE
Neisseria Gonorrhea: NEGATIVE

## 2015-02-18 ENCOUNTER — Encounter: Payer: Self-pay | Admitting: Family

## 2015-02-18 LAB — CERVICOVAGINAL ANCILLARY ONLY: Wet Prep (BD Affirm): NEGATIVE

## 2015-09-07 ENCOUNTER — Other Ambulatory Visit: Payer: Self-pay | Admitting: Family

## 2015-11-24 ENCOUNTER — Ambulatory Visit: Payer: Self-pay | Admitting: Physician Assistant

## 2015-12-22 ENCOUNTER — Encounter: Payer: Self-pay | Admitting: Family

## 2016-01-28 ENCOUNTER — Other Ambulatory Visit: Payer: Self-pay | Admitting: Family

## 2016-01-28 MED ORDER — NORETHINDRONE-ETH ESTRADIOL 1-35 MG-MCG PO TABS: 1.0000 | ORAL_TABLET | Freq: Every day | ORAL | Status: DC

## 2016-01-28 NOTE — Telephone Encounter (Signed)
Relation to WG:NFAOpt:self Call back [phone] Pharmacy: [id number] - [place], [place] - [address] [phone] (Phone) [phone] (Fax)         Reason for call:  Patient requesting a refill NECON 1/35, 28, tablet 30 day supply to hold her over until 03/09/16 physical appointment.

## 2016-01-28 NOTE — Telephone Encounter (Signed)
Rx sent 

## 2016-03-09 ENCOUNTER — Encounter: Payer: Self-pay | Admitting: Family

## 2016-03-09 ENCOUNTER — Ambulatory Visit (INDEPENDENT_AMBULATORY_CARE_PROVIDER_SITE_OTHER): Payer: Managed Care, Other (non HMO) | Admitting: Family

## 2016-03-09 VITALS — BP 104/78 | HR 81 | Temp 97.8°F | Resp 18 | Ht 64.0 in | Wt 202.4 lb

## 2016-03-09 DIAGNOSIS — Z0000 Encounter for general adult medical examination without abnormal findings: Secondary | ICD-10-CM | POA: Diagnosis not present

## 2016-03-09 DIAGNOSIS — Z113 Encounter for screening for infections with a predominantly sexual mode of transmission: Secondary | ICD-10-CM

## 2016-03-09 LAB — URINALYSIS, ROUTINE W REFLEX MICROSCOPIC
BILIRUBIN URINE: NEGATIVE
Ketones, ur: NEGATIVE
LEUKOCYTES UA: NEGATIVE
NITRITE: NEGATIVE
Specific Gravity, Urine: 1.025 (ref 1.000–1.030)
TOTAL PROTEIN, URINE-UPE24: NEGATIVE
URINE GLUCOSE: NEGATIVE
Urobilinogen, UA: 1 (ref 0.0–1.0)
pH: 5.5 (ref 5.0–8.0)

## 2016-03-09 LAB — CBC WITH DIFFERENTIAL/PLATELET
BASOS ABS: 0 10*3/uL (ref 0.0–0.1)
BASOS PCT: 0.3 % (ref 0.0–3.0)
EOS PCT: 1.1 % (ref 0.0–5.0)
Eosinophils Absolute: 0.1 10*3/uL (ref 0.0–0.7)
HEMATOCRIT: 38.7 % (ref 36.0–46.0)
Hemoglobin: 13.2 g/dL (ref 12.0–15.0)
LYMPHS PCT: 35 % (ref 12.0–46.0)
Lymphs Abs: 2.3 10*3/uL (ref 0.7–4.0)
MCHC: 34.2 g/dL (ref 30.0–36.0)
MCV: 92.4 fl (ref 78.0–100.0)
MONOS PCT: 5.7 % (ref 3.0–12.0)
Monocytes Absolute: 0.4 10*3/uL (ref 0.1–1.0)
Neutro Abs: 3.9 10*3/uL (ref 1.4–7.7)
Neutrophils Relative %: 57.9 % (ref 43.0–77.0)
PLATELETS: 215 10*3/uL (ref 150.0–400.0)
RBC: 4.19 Mil/uL (ref 3.87–5.11)
RDW: 13.3 % (ref 11.5–15.5)
WBC: 6.7 10*3/uL (ref 4.0–10.5)

## 2016-03-09 LAB — BASIC METABOLIC PANEL
BUN: 14 mg/dL (ref 6–23)
CHLORIDE: 106 meq/L (ref 96–112)
CO2: 23 mEq/L (ref 19–32)
Calcium: 9.2 mg/dL (ref 8.4–10.5)
Creatinine, Ser: 0.78 mg/dL (ref 0.40–1.20)
GFR: 110.28 mL/min (ref >60.00)
Glucose, Bld: 91 mg/dL (ref 70–99)
POTASSIUM: 4.2 meq/L (ref 3.5–5.1)
SODIUM: 136 meq/L (ref 135–145)

## 2016-03-09 LAB — HEPATIC FUNCTION PANEL
ALK PHOS: 46 U/L (ref 39–117)
ALT: 16 U/L (ref 0–35)
AST: 17 U/L (ref 0–37)
Albumin: 3.9 g/dL (ref 3.5–5.2)
BILIRUBIN DIRECT: 0.1 mg/dL (ref 0.0–0.3)
BILIRUBIN TOTAL: 0.5 mg/dL (ref 0.2–1.2)
TOTAL PROTEIN: 7 g/dL (ref 6.0–8.3)

## 2016-03-09 LAB — LIPID PANEL
CHOL/HDL RATIO: 4
Cholesterol: 164 mg/dL (ref 0–200)
HDL: 44.8 mg/dL (ref >39.00)
LDL CALC: 97 mg/dL (ref 0–99)
NONHDL: 119.02
TRIGLYCERIDES: 108 mg/dL (ref 0.0–149.0)
VLDL: 21.6 mg/dL (ref 0.0–40.0)

## 2016-03-09 LAB — TSH: TSH: 2.8 u[IU]/mL (ref 0.35–4.50)

## 2016-03-09 MED ORDER — NORETHINDRONE-ETH ESTRADIOL 1-35 MG-MCG PO TABS: 1.0000 | ORAL_TABLET | Freq: Every day | ORAL | 11 refills | Status: DC

## 2016-03-09 NOTE — Assessment & Plan Note (Addendum)
Discussed healthy diet, exercise, weight loss. She requests STD screen, denies symptoms or new partners.  Pap up to date.

## 2016-03-09 NOTE — Addendum Note (Signed)
Addended by: Harley AltoPRICE, KRISTY M on: 03/09/2016 08:10 AM   Modules accepted: Orders

## 2016-03-09 NOTE — Patient Instructions (Addendum)
Please complete lab work prior to leaving. Work on [employer] food choices- packing food for [employer]work/snacks. Continue your exercise. Please schedule a routine eye exam.

## 2016-03-09 NOTE — Progress Notes (Signed)
Subjective:    Patient ID: Erin English, female    DOB: 06/03/1986, 30 y.o.   MRN: [id number]  HPI  Patient presents today for complete physical.  Immunizations: up to date Diet: "not so good" too much junk fod Exercise: started "boot camp" Pap Smear: 10/07/15 Dental: up to date Vision: due     Review of Systems  Constitutional: Negative for unexpected weight change.  HENT: Negative for hearing loss and rhinorrhea.   Eyes: Negative for visual disturbance.  Respiratory: Negative for cough.   Cardiovascular: Negative for leg swelling.  Gastrointestinal: Negative for constipation and diarrhea.  Genitourinary: Negative for dysuria, frequency and hematuria.  Musculoskeletal: Negative for arthralgias and myalgias.  Skin: Negative for rash.  Neurological: Negative for headaches.  Hematological: Negative for adenopathy.  Psychiatric/Behavioral:       Denies depression/anxiety   Past Medical History:  Diagnosis Date  . Anemia   . Genital warts   . Herpes   . History of chicken pox      Social History   Social History  . Marital status: Single    Spouse name: N/A  . Number of children: N/A  . Years of education: N/A   Occupational History  . Not on file.   Social History Main Topics  . Smoking status: Never Smoker  . Smokeless tobacco: Never Used  . Alcohol use 1.0 oz/week    2 drink(s) per week     Comment: social  . Drug use: No  . Sexual activity: Not on file   Other Topics Concern  . Not on file   Social History Narrative   Single   Lives alone   Works in Programmer, systemsaccounting   Bachelors degree   Enjoys sleeping, sewing, cooking   No pets, non-smoker    Past Surgical History:  Procedure Laterality Date  . CHOLECYSTECTOMY  2008    Family History  Problem Relation Age of Onset  . Hypertension Father   . Cancer Paternal Grandmother     breast  . Cancer Paternal Grandfather     prostate    No Known Allergies  Current Outpatient Prescriptions on  File Prior to Visit  Medication Sig Dispense Refill  . norethindrone-ethinyl estradiol 1/35 (NECON 1/35, 28,) tablet Take 1 tablet by mouth daily. 28 tablet 0  . Prenatal Vit-Fe Fumarate-FA (MULTIVITAMIN-PRENATAL) 27-0.8 MG TABS tablet Take 1 tablet by mouth daily at 12 noon.     No current facility-administered medications on file prior to visit.     BP 104/78 (BP Location: Right Arm, Cuff Size: Normal)   Pulse 81   Temp 97.8 F (36.6 C) (Oral)   Resp 18   Ht 5\' 4"  (1.626 m)   Wt 202 lb 6.4 oz (91.8 kg)   LMP 03/02/2016   SpO2 98% Comment: room air  BMI 34.74 kg/m       Objective:   Physical Exam  Physical Exam  Constitutional: She is oriented to person, place, and time. She appears well-developed and well-nourished. No distress.  HENT:  Head: Normocephalic and atraumatic.  Right Ear: Tympanic membrane and ear canal normal.  Left Ear: Tympanic membrane and ear canal normal.  Mouth/Throat: Oropharynx is clear and moist.  Eyes: Pupils are equal, round, and reactive to light. No scleral icterus.  Neck: Normal range of motion. No thyromegaly present.  Cardiovascular: Normal rate and regular rhythm.   No murmur heard. Pulmonary/Chest: Effort normal and breath sounds normal. No respiratory distress. He has no wheezes. She has  no rales. She exhibits no tenderness.  Abdominal: Soft. Bowel sounds are normal. She exhibits no distension and no mass. There is no tenderness. There is no rebound and no guarding.  Musculoskeletal: She exhibits no edema.  Lymphadenopathy:    She has no cervical adenopathy.  Neurological: She is alert and oriented to person, place, and time. She has normal patellar reflexes. She exhibits normal muscle tone. Coordination normal.  Skin: Skin is warm and dry.  Psychiatric: She has a normal mood and affect. Her behavior is normal. Judgment and thought content normal.  Breasts: Examined lying Right: Without masses, retractions, discharge or axillary  adenopathy.  Left: Without masses, retractions, discharge or axillary adenopathy.         Assessment & Plan:         Assessment & Plan:

## 2016-03-09 NOTE — Progress Notes (Signed)
Pre visit review using our clinic review tool, if applicable. No additional management support is needed unless otherwise documented below in the visit note. 

## 2016-03-10 ENCOUNTER — Encounter: Payer: Self-pay | Admitting: Family

## 2016-03-10 LAB — GC/CHLAMYDIA PROBE AMP
CT Probe RNA: NOT DETECTED
GC Probe RNA: NOT DETECTED

## 2016-03-10 LAB — RPR

## 2016-03-10 LAB — HIV ANTIBODY (ROUTINE TESTING W REFLEX): HIV 1&2 Ab, 4th Generation: NONREACTIVE

## 2016-03-14 ENCOUNTER — Encounter: Payer: Self-pay | Admitting: Family

## 2016-03-26 ENCOUNTER — Other Ambulatory Visit: Payer: Self-pay | Admitting: Family

## 2016-08-23 ENCOUNTER — Telehealth: Payer: Self-pay | Admitting: Family

## 2016-08-23 NOTE — Telephone Encounter (Signed)
Caller name: Relationship to patient: Self Can be reached: [phone] Pharmacy:  Reason for call: Patient request to know if it is safe to take the Hep B Series. States employer wants her to have it. Patient wants providers opinion.

## 2016-08-24 NOTE — Telephone Encounter (Signed)
Yes, ok to complete hep b series.

## 2016-08-24 NOTE — Telephone Encounter (Signed)
Please advise    PC 

## 2016-08-24 NOTE — Telephone Encounter (Signed)
Patient has been made aware of advice.  PC

## 2017-02-20 ENCOUNTER — Other Ambulatory Visit: Payer: Self-pay | Admitting: Family

## 2017-02-21 NOTE — Telephone Encounter (Signed)
1 month supply of birth control Rx sent to pharmacy. Pt due for CPE on or after 03/10/17. Mychart message sent to pt.

## 2017-03-16 ENCOUNTER — Encounter: Payer: Self-pay | Admitting: Family

## 2017-03-16 MED ORDER — NORETHINDRONE-ETH ESTRADIOL 1-35 MG-MCG PO TABS: 1.0000 | ORAL_TABLET | Freq: Every day | ORAL | 0 refills | Status: DC

## 2017-04-07 ENCOUNTER — Encounter: Payer: Self-pay | Admitting: Family

## 2017-04-07 ENCOUNTER — Other Ambulatory Visit (HOSPITAL_COMMUNITY)
Admission: RE | Admit: 2017-04-07 | Discharge: 2017-04-07 | Disposition: A | Discharge status: Home / Self Care | Payer: Managed Care, Other (non HMO) | Source: Ambulatory Visit | Attending: Family | Admitting: Family

## 2017-04-07 ENCOUNTER — Ambulatory Visit (INDEPENDENT_AMBULATORY_CARE_PROVIDER_SITE_OTHER): Payer: Managed Care, Other (non HMO) | Admitting: Family

## 2017-04-07 VITALS — BP 98/65 | HR 83 | Temp 98.5°F | Resp 18 | Ht 64.0 in | Wt 218.2 lb

## 2017-04-07 DIAGNOSIS — Z01419 Encounter for gynecological examination (general) (routine) without abnormal findings: Secondary | ICD-10-CM | POA: Diagnosis not present

## 2017-04-07 DIAGNOSIS — N898 Other specified noninflammatory disorders of vagina: Secondary | ICD-10-CM | POA: Insufficient documentation

## 2017-04-07 DIAGNOSIS — Z0000 Encounter for general adult medical examination without abnormal findings: Secondary | ICD-10-CM | POA: Diagnosis not present

## 2017-04-07 LAB — CBC WITH DIFFERENTIAL/PLATELET
BASOS PCT: 0.9 % (ref 0.0–3.0)
Basophils Absolute: 0.1 10*3/uL (ref 0.0–0.1)
EOS PCT: 3.1 % (ref 0.0–5.0)
Eosinophils Absolute: 0.2 10*3/uL (ref 0.0–0.7)
HCT: 40.3 % (ref 36.0–46.0)
Hemoglobin: 13.5 g/dL (ref 12.0–15.0)
LYMPHS ABS: 2.3 10*3/uL (ref 0.7–4.0)
Lymphocytes Relative: 36.4 % (ref 12.0–46.0)
MCHC: 33.5 g/dL (ref 30.0–36.0)
MCV: 94.6 fl (ref 78.0–100.0)
MONO ABS: 0.3 10*3/uL (ref 0.1–1.0)
Monocytes Relative: 4.3 % (ref 3.0–12.0)
NEUTROS ABS: 3.5 10*3/uL (ref 1.4–7.7)
NEUTROS PCT: 55.3 % (ref 43.0–77.0)
PLATELETS: 236 10*3/uL (ref 150.0–400.0)
RBC: 4.26 Mil/uL (ref 3.87–5.11)
RDW: 13 % (ref 11.5–15.5)
WBC: 6.3 10*3/uL (ref 4.0–10.5)

## 2017-04-07 LAB — BASIC METABOLIC PANEL
BUN: 17 mg/dL (ref 6–23)
CHLORIDE: 106 meq/L (ref 96–112)
CO2: 22 mEq/L (ref 19–32)
Calcium: 9.4 mg/dL (ref 8.4–10.5)
Creatinine, Ser: 0.84 mg/dL (ref 0.40–1.20)
GFR: 100.56 mL/min (ref >60.00)
GLUCOSE: 101 mg/dL — AB (ref 70–99)
Potassium: 4 mEq/L (ref 3.5–5.1)
Sodium: 137 mEq/L (ref 135–145)

## 2017-04-07 LAB — URINALYSIS, ROUTINE W REFLEX MICROSCOPIC
BILIRUBIN URINE: NEGATIVE
Ketones, ur: NEGATIVE
LEUKOCYTES UA: NEGATIVE
NITRITE: NEGATIVE
Specific Gravity, Urine: >=1.03 — AB (ref 1.000–1.030)
Total Protein, Urine: NEGATIVE
URINE GLUCOSE: NEGATIVE
Urobilinogen, UA: 0.2 (ref 0.0–1.0)
pH: 5.5 (ref 5.0–8.0)

## 2017-04-07 LAB — HEPATIC FUNCTION PANEL
ALT: 17 U/L (ref 0–35)
AST: 15 U/L (ref 0–37)
Albumin: 3.9 g/dL (ref 3.5–5.2)
Alkaline Phosphatase: 47 U/L (ref 39–117)
BILIRUBIN TOTAL: 0.5 mg/dL (ref 0.2–1.2)
Bilirubin, Direct: 0.1 mg/dL (ref 0.0–0.3)
Total Protein: 7.5 g/dL (ref 6.0–8.3)

## 2017-04-07 LAB — LIPID PANEL
CHOLESTEROL: 169 mg/dL (ref 0–200)
HDL: 59.8 mg/dL (ref >39.00)
LDL CALC: 86 mg/dL (ref 0–99)
NonHDL: 109.48
TRIGLYCERIDES: 115 mg/dL (ref 0.0–149.0)
Total CHOL/HDL Ratio: 3
VLDL: 23 mg/dL (ref 0.0–40.0)

## 2017-04-07 LAB — TSH: TSH: 1.66 u[IU]/mL (ref 0.35–4.50)

## 2017-04-07 MED ORDER — NORETHINDRONE-ETH ESTRADIOL 1-35 MG-MCG PO TABS: 1.0000 | ORAL_TABLET | Freq: Every day | ORAL | 4 refills | Status: DC

## 2017-04-07 NOTE — Addendum Note (Signed)
Addended by: Mervin Kung A on: 04/07/2017 08:55 AM   Modules accepted: Orders

## 2017-04-07 NOTE — Progress Notes (Signed)
Subjective:    Patient ID: Erin English, female    DOB: [birth year], 31 y.o.   MRN: [id number]  HPI  Erin English is a 31 yr old female who presents today for cpx.  Immunizations: tetanus 8/15, declines flu Diet: reports that diet could be improved. Eats out a lot.  [facility]  Does cardio (elliptical) Pap Smear: 3/15 Vision: last year Dental: up to date Wt Readings from Last 3 Encounters:  04/07/17 218 lb 3.2 oz (99 kg)  03/09/16 202 lb 6.4 oz (91.8 kg)  02/16/15 198 lb 6.4 oz (90 kg)     Review of Systems  Constitutional: Negative for unexpected weight change.  HENT: Negative for hearing loss and rhinorrhea.   Eyes: Negative for visual disturbance.  Respiratory: Negative for cough.   Cardiovascular: Negative for leg swelling.  Gastrointestinal: Negative for constipation and diarrhea.  Genitourinary: Negative for dysuria and frequency.  Musculoskeletal: Negative for arthralgias and myalgias.  Skin: Negative for rash.  Neurological: Negative for headaches.  Hematological: Negative for adenopathy.  Psychiatric/Behavioral:       Denies depression/anxiety       Past Medical History:  Diagnosis Date  . Anemia   . Genital warts   . Herpes   . History of chicken pox      Social History   Social History  . Marital status: Single    Spouse name: N/A  . Number of children: N/A  . Years of education: N/A   Occupational History  . Not on file.   Social History Main Topics  . Smoking status: Never Smoker  . Smokeless tobacco: Never Used  . Alcohol use 1.0 oz/week    2 Standard drinks or equivalent per week     Comment: social  . Drug use: No  . Sexual activity: Yes    Partners: Male   Other Topics Concern  . Not on file   Social History Narrative   Single   Lives alone   Works in Orthoptist degree   Enjoys sleeping, sewing, cooking   No pets, non-smoker    Past Surgical History:    Procedure Laterality Date  . CHOLECYSTECTOMY  2008    Family History  Problem Relation Age of Onset  . Hypertension Father   . Cancer Paternal Grandmother        breast  . Cancer Paternal Grandfather        prostate    No Known Allergies  Current Outpatient Prescriptions on File Prior to Visit  Medication Sig Dispense Refill  . Prenatal Vit-Fe Fumarate-FA (MULTIVITAMIN-PRENATAL) 27-0.8 MG TABS tablet Take 1 tablet by mouth daily at 12 noon.     No current facility-administered medications on file prior to visit.     BP 98/65   Pulse 83   Temp 98.5 F (36.9 C) (Oral)   Resp 18   Ht  (1.626 m)   Wt 218 lb 3.2 oz (99 kg)   LMP 03/24/2017   SpO2 99%   BMI 37.45 kg/m    Objective:   Physical Exam  Physical Exam  Constitutional: She is oriented to person, place, and time. She appears well-developed and well-nourished. No distress.  HENT:  Head: Normocephalic and atraumatic.  Right Ear: Tympanic membrane and ear canal normal.  Left Ear: Tympanic membrane and ear canal normal.  Mouth/Throat: Oropharynx is clear and moist.  Eyes: Pupils are equal, round, and reactive  to light. No scleral icterus.  Neck: Normal range of motion. No thyromegaly present.  Cardiovascular: Normal rate and regular rhythm.   No murmur heard. Pulmonary/Chest: Effort normal and breath sounds normal. No respiratory distress. He has no wheezes. She has no rales. She exhibits no tenderness.  Abdominal: Soft. Bowel sounds are normal. She exhibits no distension and no mass. There is no tenderness. There is no rebound and no guarding.  Musculoskeletal: She exhibits no edema.  Lymphadenopathy:    She has no cervical adenopathy.  Neurological: She is alert and oriented to person, place, and time. She has normal patellar reflexes. She exhibits normal muscle tone. Coordination normal.  Skin: Skin is warm and dry.  Psychiatric: She has a normal mood and affect. Her behavior is normal. Judgment and  thought content normal.  Breasts: Examined lying Right: Without masses, retractions, discharge or axillary adenopathy.  Left: Without masses, retractions, discharge or axillary adenopathy.  Inguinal/mons: Normal without inguinal adenopathy  External genitalia: Normal  BUS/Urethra/Skene's glands: Normal  Bladder: Normal  Vagina: Normal, some white discharge is noted Cervix: Normal  Uterus: normal in size, shape and contour. Midline and mobile  Adnexa/parametria:  Rt: Without masses or tenderness.  Lt: Without masses or tenderness.  Anus and perineum: Normal            Assessment & Plan:   Preventative Care- discussed healthy diet, exercise and weight loss.  Obtain routine lab work.  Pap performed today.  Will send for ancillary testing for yeast due to discharge.  Waist circumference is 41 inches. She will send me form for her employer.        Assessment & Plan:

## 2017-04-07 NOTE — Patient Instructions (Signed)
Continue to work on [employer], portion control and exercise.

## 2017-04-10 ENCOUNTER — Other Ambulatory Visit: Payer: Self-pay | Admitting: Family

## 2017-04-10 NOTE — Telephone Encounter (Signed)
Above form needs waist circumference and I do not see that we obtained that at most recent CPE. Emailed pt, awaiting response. Form completed as much as possible and forwarded to PCP for signature.

## 2017-04-11 LAB — CYTOLOGY - PAP
Bacterial vaginitis: NEGATIVE
CANDIDA VAGINITIS: NEGATIVE
Diagnosis: NEGATIVE
HPV (WINDOPATH): NOT DETECTED

## 2017-04-11 NOTE — Telephone Encounter (Signed)
Attempted to email form to pt and received notice that transmission failed due to recipient's email undergoing maintenance and to try again later. Will try to send tomorrow.

## 2017-09-12 ENCOUNTER — Encounter: Payer: Self-pay | Admitting: Family

## 2017-10-31 ENCOUNTER — Encounter: Payer: Self-pay | Admitting: Family

## 2017-11-01 ENCOUNTER — Other Ambulatory Visit: Payer: Self-pay | Admitting: Family

## 2017-11-01 MED ORDER — FLUTICASONE PROPIONATE 50 MCG/ACT NA SUSP: 2.0000 | Freq: Every day | NASAL | 6 refills | Status: DC

## 2018-01-22 ENCOUNTER — Encounter: Payer: Self-pay | Admitting: Family

## 2018-03-02 ENCOUNTER — Encounter: Payer: Self-pay | Admitting: Family Medicine

## 2018-03-02 ENCOUNTER — Ambulatory Visit (INDEPENDENT_AMBULATORY_CARE_PROVIDER_SITE_OTHER): Payer: Worker's Compensation | Admitting: Family Medicine

## 2018-03-02 ENCOUNTER — Ambulatory Visit (HOSPITAL_BASED_OUTPATIENT_CLINIC_OR_DEPARTMENT_OTHER)
Admission: RE | Admit: 2018-03-02 | Discharge: 2018-03-02 | Disposition: A | Discharge status: Home / Self Care | Payer: Worker's Compensation | Source: Ambulatory Visit | Attending: Family Medicine | Admitting: Family Medicine

## 2018-03-02 VITALS — BP 122/82 | HR 57 | Temp 98.2°F | Ht 64.0 in | Wt 180.0 lb

## 2018-03-02 DIAGNOSIS — W228XXA Striking against or struck by other objects, initial encounter: Secondary | ICD-10-CM | POA: Diagnosis not present

## 2018-03-02 DIAGNOSIS — S97112A Crushing injury of left great toe, initial encounter: Secondary | ICD-10-CM | POA: Insufficient documentation

## 2018-03-02 DIAGNOSIS — S99922A Unspecified injury of left foot, initial encounter: Secondary | ICD-10-CM

## 2018-03-02 DIAGNOSIS — Y939 Activity, unspecified: Secondary | ICD-10-CM | POA: Insufficient documentation

## 2018-03-02 DIAGNOSIS — S92412A Displaced fracture of proximal phalanx of left great toe, initial encounter for closed fracture: Secondary | ICD-10-CM | POA: Insufficient documentation

## 2018-03-02 NOTE — Patient Instructions (Addendum)
Activity as tolerated.   We will send a MyChart message regarding the official read of the X-ray.  Ice/cold pack over area for 10-15 min twice daily.  OK to take Tylenol 1000 mg (2 extra strength tabs) or 975 mg (3 regular strength tabs) every 6 hours as needed.  Ibuprofen 400-600 mg (2-3 over the counter strength tabs) every 6 hours as needed for pain.  Let us know if you need anything.

## 2018-03-02 NOTE — Progress Notes (Signed)
Musculoskeletal Exam  Patient: Erin English DOB: 03/08/1986  DOS: 03/02/2018  SUBJECTIVE:  Chief Complaint:   Chief Complaint  Patient presents with  . Toe Injury    dropped a 25lb weight on left big toe sunday    Erin RevelRochelle Kasinger is a 32 y.o.  female for evaluation and treatment of L great toe pain.   Onset:  5 days ago.  Dropped a 25 lb plate/wt on it.  Location: L great toe Character:  sharp and stabbing  Progression of issue:  has slightly improved Associated symptoms: Swelling, bruising Treatment: to date has been OTC NSAIDS.   Neurovascular symptoms: no  ROS: Musculoskeletal/Extremities: +Great toe pain  Past Medical History:  Diagnosis Date  . Anemia   . Genital warts   . Herpes   . History of chicken pox     Objective: VITAL SIGNS: BP 122/82 (BP Location: Left Arm, Patient Position: Sitting, Cuff Size: Large)   Pulse (!) 57   Temp 98.2 F (36.8 C) (Oral)   Ht 5\' 4"  (1.626 m)   Wt 180 lb (81.6 kg)   LMP 02/16/2018   SpO2 99%   BMI 30.90 kg/m  Constitutional: Well formed, well developed. No acute distress. Cardiovascular: Brisk cap refill Thorax & Lungs: No accessory muscle use Musculoskeletal: L great toe.   Normal active range of motion: yes.   Normal passive range of motion: yes Tenderness to palpation: yes over medial prox phalanx Deformity: no Ecchymosis: yes Neurologic: Normal sensory function. No focal deficits noted. Psychiatric: Normal mood. Age appropriate judgment and insight. Alert & oriented x 3.    Assessment:  Injury of great toe, left, initial encounter - Plan: DG Toe Great Left  Plan: Unofficially a fx on lat view? Await official read. Will put in flat soled shoe. Ice, Nsaids, Tylenol, activity as tolerated. F/u . The patient voiced understanding and agreement to the plan.   Jilda Rocheicholas [place], DO 03/02/18  4:33 PM

## 2018-03-02 NOTE — Progress Notes (Signed)
Pre visit review using our clinic review tool, if applicable. No additional management support is needed unless otherwise documented below in the visit note. 

## 2018-03-23 ENCOUNTER — Ambulatory Visit (INDEPENDENT_AMBULATORY_CARE_PROVIDER_SITE_OTHER): Payer: Worker's Compensation | Admitting: Family Medicine

## 2018-03-23 ENCOUNTER — Encounter: Payer: Self-pay | Admitting: Family Medicine

## 2018-03-23 ENCOUNTER — Ambulatory Visit: Payer: Managed Care, Other (non HMO) | Admitting: Family Medicine

## 2018-03-23 ENCOUNTER — Ambulatory Visit (HOSPITAL_BASED_OUTPATIENT_CLINIC_OR_DEPARTMENT_OTHER)
Admission: RE | Admit: 2018-03-23 | Discharge: 2018-03-23 | Disposition: A | Discharge status: Home / Self Care | Payer: Worker's Compensation | Source: Ambulatory Visit | Attending: Family Medicine | Admitting: Family Medicine

## 2018-03-23 VITALS — BP 108/70 | HR 72 | Temp 98.8°F | Ht 64.0 in | Wt 169.2 lb

## 2018-03-23 DIAGNOSIS — X58XXXD Exposure to other specified factors, subsequent encounter: Secondary | ICD-10-CM | POA: Diagnosis not present

## 2018-03-23 DIAGNOSIS — S92425D Nondisplaced fracture of distal phalanx of left great toe, subsequent encounter for fracture with routine healing: Secondary | ICD-10-CM

## 2018-03-23 NOTE — Patient Instructions (Signed)
We will be in touch regarding the X-ray results.   OK to wear flat sole shoe as needed at this point.  Let us know if you need anything.

## 2018-03-23 NOTE — Progress Notes (Signed)
Pre visit review using our clinic review tool, if applicable. No additional management support is needed unless otherwise documented below in the visit note. 

## 2018-03-23 NOTE — Progress Notes (Signed)
Chief Complaint  Patient presents with  . Follow-up    left big toe    Subjective: Patient is a 32 y.o. female here for f/u toe fx.  Pt had broken L distal phalanx around 02/25/18 after dropping a 25 lb wt on top of it. Was placed in flat shoe and rec'd activity as tolerated. Pain much better, still having some decreased ROM. Will intermittently wear the flat soled shoe.   ROS: MSK: +mild pain over toe  Past Medical History:  Diagnosis Date  . Anemia   . Genital warts   . Herpes   . History of chicken pox     Objective: BP 108/70 (BP Location: Left Arm, Patient Position: Sitting, Cuff Size: Normal)   Pulse 72   Temp 98.8 F (37.1 C) (Oral)   Ht 5\' 4"  (1.626 m)   Wt 169 lb 4 oz (76.8 kg)   SpO2 98%   BMI 29.05 kg/m  General: Awake, appears stated age MSK: No ttp over L great toe, no deformity or erythema, +decreased active ROM, normal gait Heart: brisk cap refill Lungs: No accessory muscle use Psych: Age appropriate judgment and insight, normal affect and mood  Assessment and Plan: Closed nondisplaced fracture of distal phalanx of left great toe with routine healing, subsequent encounter - Plan: DG Toe Great Left  Orders as above. Gave another flat soled shoe- wear prn. She is progressing well from clinical standpoint. Her strength of MTP and IP joint does not make me worry for a tendon rupture, though even if it did not get better, pt would not be interested in correctional procedure. F/u as originally scheduled for CPE w reg PCP, can decide on XR's then.  The patient voiced understanding and agreement to the plan.  Jilda Rocheicholas Paul [place], DO 03/23/18  8:09 AM

## 2018-04-04 ENCOUNTER — Other Ambulatory Visit (HOSPITAL_COMMUNITY)
Admission: RE | Admit: 2018-04-04 | Discharge: 2018-04-04 | Disposition: A | Discharge status: Home / Self Care | Payer: Managed Care, Other (non HMO) | Source: Ambulatory Visit | Attending: Family | Admitting: Family

## 2018-04-04 ENCOUNTER — Ambulatory Visit (HOSPITAL_BASED_OUTPATIENT_CLINIC_OR_DEPARTMENT_OTHER)
Admission: RE | Admit: 2018-04-04 | Discharge: 2018-04-04 | Disposition: A | Discharge status: Home / Self Care | Payer: Worker's Compensation | Source: Ambulatory Visit | Attending: Family | Admitting: Family

## 2018-04-04 ENCOUNTER — Telehealth: Payer: Self-pay | Admitting: *Deleted

## 2018-04-04 ENCOUNTER — Encounter: Payer: Self-pay | Admitting: Family

## 2018-04-04 ENCOUNTER — Ambulatory Visit (INDEPENDENT_AMBULATORY_CARE_PROVIDER_SITE_OTHER): Payer: Managed Care, Other (non HMO) | Admitting: Family

## 2018-04-04 VITALS — BP 103/82 | HR 77 | Temp 98.8°F | Resp 16 | Ht 64.0 in | Wt 166.2 lb

## 2018-04-04 DIAGNOSIS — X58XXXA Exposure to other specified factors, initial encounter: Secondary | ICD-10-CM | POA: Diagnosis not present

## 2018-04-04 DIAGNOSIS — Z09 Encounter for follow-up examination after completed treatment for conditions other than malignant neoplasm: Secondary | ICD-10-CM | POA: Diagnosis not present

## 2018-04-04 DIAGNOSIS — Z113 Encounter for screening for infections with a predominantly sexual mode of transmission: Secondary | ICD-10-CM | POA: Insufficient documentation

## 2018-04-04 DIAGNOSIS — Z0000 Encounter for general adult medical examination without abnormal findings: Secondary | ICD-10-CM

## 2018-04-04 DIAGNOSIS — S92405A Nondisplaced unspecified fracture of left great toe, initial encounter for closed fracture: Secondary | ICD-10-CM | POA: Diagnosis present

## 2018-04-04 LAB — LIPID PANEL
CHOLESTEROL: 151 mg/dL (ref 0–200)
HDL: 50.9 mg/dL (ref >39.00)
LDL Cholesterol: 84 mg/dL (ref 0–99)
NONHDL: 100.34
TRIGLYCERIDES: 81 mg/dL (ref 0.0–149.0)
Total CHOL/HDL Ratio: 3
VLDL: 16.2 mg/dL (ref 0.0–40.0)

## 2018-04-04 LAB — HEPATIC FUNCTION PANEL
ALBUMIN: 4 g/dL (ref 3.5–5.2)
ALK PHOS: 32 U/L — AB (ref 39–117)
ALT: 36 U/L — ABNORMAL HIGH (ref 0–35)
AST: 21 U/L (ref 0–37)
Bilirubin, Direct: 0.1 mg/dL (ref 0.0–0.3)
Total Bilirubin: 0.6 mg/dL (ref 0.2–1.2)
Total Protein: 6.9 g/dL (ref 6.0–8.3)

## 2018-04-04 LAB — BASIC METABOLIC PANEL
BUN: 8 mg/dL (ref 6–23)
CALCIUM: 9.3 mg/dL (ref 8.4–10.5)
CHLORIDE: 103 meq/L (ref 96–112)
CO2: 25 meq/L (ref 19–32)
Creatinine, Ser: 0.93 mg/dL (ref 0.40–1.20)
GFR: 88.87 mL/min (ref >60.00)
GLUCOSE: 72 mg/dL (ref 70–99)
Potassium: 4.5 mEq/L (ref 3.5–5.1)
SODIUM: 137 meq/L (ref 135–145)

## 2018-04-04 LAB — URINALYSIS, ROUTINE W REFLEX MICROSCOPIC
Hgb urine dipstick: NEGATIVE
KETONES UR: 40 — AB
LEUKOCYTES UA: NEGATIVE
NITRITE: NEGATIVE
Specific Gravity, Urine: 1.02 (ref 1.000–1.030)
TOTAL PROTEIN, URINE-UPE24: NEGATIVE
URINE GLUCOSE: NEGATIVE
UROBILINOGEN UA: 0.2 (ref 0.0–1.0)
pH: 5.5 (ref 5.0–8.0)

## 2018-04-04 LAB — CBC WITH DIFFERENTIAL/PLATELET
BASOS ABS: 0 10*3/uL (ref 0.0–0.1)
BASOS PCT: 0.3 % (ref 0.0–3.0)
Eosinophils Absolute: 0.1 10*3/uL (ref 0.0–0.7)
Eosinophils Relative: 2.1 % (ref 0.0–5.0)
HCT: 40.3 % (ref 36.0–46.0)
Hemoglobin: 13.5 g/dL (ref 12.0–15.0)
LYMPHS ABS: 2 10*3/uL (ref 0.7–4.0)
LYMPHS PCT: 40.3 % (ref 12.0–46.0)
MCHC: 33.6 g/dL (ref 30.0–36.0)
MCV: 93.9 fl (ref 78.0–100.0)
MONOS PCT: 5.9 % (ref 3.0–12.0)
Monocytes Absolute: 0.3 10*3/uL (ref 0.1–1.0)
NEUTROS ABS: 2.5 10*3/uL (ref 1.4–7.7)
Neutrophils Relative %: 51.4 % (ref 43.0–77.0)
PLATELETS: 230 10*3/uL (ref 150.0–400.0)
RBC: 4.29 Mil/uL (ref 3.87–5.11)
RDW: 13.3 % (ref 11.5–15.5)
WBC: 4.9 10*3/uL (ref 4.0–10.5)

## 2018-04-04 LAB — TSH: TSH: 4.34 u[IU]/mL (ref 0.35–4.50)

## 2018-04-04 MED ORDER — NORETHINDRONE-ETH ESTRADIOL 1-35 MG-MCG PO TABS: 1.0000 | ORAL_TABLET | Freq: Every day | ORAL | 4 refills | Status: DC

## 2018-04-04 NOTE — Progress Notes (Signed)
Subjective:    Patient ID: Erin English, female    DOB: 31-May-1986, 32 y.o.   MRN: [id number]  HPI  Patient presents today for complete physical.  Immunizations: Tetanus 2015, declines flu shot Wt Readings from Last 3 Encounters:  04/04/18 166 lb 3.2 oz (75.4 kg)  03/23/18 169 lb 4 oz (76.8 kg)  03/02/18 180 lb (81.6 kg)   Diet: has improved her diet Exercise: YMA, classes, elliptical Pap Smear: 2018 Vision: 2018 Dental: up to date  L great toe fracture- occurred about 5 weeks ago. Last x-ray did not show callous formation. She reports that she is still having discomfort in this toe.    Review of Systems  Constitutional: Negative for unexpected weight change.  HENT: Negative for hearing loss and rhinorrhea.   Eyes: Negative for visual disturbance.  Respiratory: Negative for cough.   Cardiovascular: Negative for leg swelling.  Gastrointestinal: Positive for constipation. Negative for diarrhea.       Mild constipation which she attributes to her prenatal vitamin. She takes for health reasons- not trying to become pregnant  Genitourinary: Negative for dysuria, frequency and menstrual problem.  Musculoskeletal: Negative for arthralgias and myalgias.       Right great toe fracture in August.    Skin: Negative for rash.  Neurological: Negative for headaches.  Hematological: Negative for adenopathy.  Psychiatric/Behavioral:       Denies depression/anxiety       Past Medical History:  Diagnosis Date  . Anemia   . Genital warts   . Herpes   . History of chicken pox      Social History   Socioeconomic History  . Marital status: Single    Spouse name: Not on file  . Number of children: Not on file  . Years of education: Not on file  . Highest education level: Not on file  Occupational History  . Not on file  Social Needs  . Financial resource strain: Not on file  . Food insecurity:    Worry: Not on file    Inability: Not on file  . Transportation needs:   Medical: Not on file    Non-medical: Not on file  Tobacco Use  . Smoking status: Never Smoker  . Smokeless tobacco: Never Used  Substance and Sexual Activity  . Alcohol use: Yes    Alcohol/week: 2.0 standard drinks    Types: 2 Standard drinks or equivalent per week    Comment: social  . Drug use: No  . Sexual activity: Yes    Partners: Male  Lifestyle  . Physical activity:    Days per week: Not on file    Minutes per session: Not on file  . Stress: Not on file  Relationships  . Social connections:    Talks on phone: Not on file    Gets together: Not on file    Attends religious service: Not on file    Active member of club or organization: Not on file    Attends meetings of clubs or organizations: Not on file    Relationship status: Not on file  . Intimate partner violence:    Fear of current or ex partner: Not on file    Emotionally abused: Not on file    Physically abused: Not on file    Forced sexual activity: Not on file  Other Topics Concern  . Not on file  Social History Narrative   Single   Lives alone   Works in Audiological scientist  Bachelors degree   Enjoys sleeping, sewing, cooking   No pets, non-smoker    Past Surgical History:  Procedure Laterality Date  . CHOLECYSTECTOMY  2008    Family History  Problem Relation Age of Onset  . Hypertension Father   . Cancer Paternal Grandmother        breast  . Cancer Paternal Grandfather        prostate    No Known Allergies  Current Outpatient Medications on File Prior to Visit  Medication Sig Dispense Refill  . fluticasone (FLONASE) 50 MCG/ACT nasal spray Place 2 sprays into both nostrils daily. 16 g 6  . NORTREL 1/35, 28, tablet TAKE 1 TABLET BY MOUTH DAILY 28 tablet 11  . Prenatal Vit-Fe Fumarate-FA (MULTIVITAMIN-PRENATAL) 27-0.8 MG TABS tablet Take 1 tablet by mouth daily at 12 noon.     No current facility-administered medications on file prior to visit.     BP 103/82 (BP Location: Left Arm, Cuff Size:  Normal)   Pulse 77   Temp 98.8 F (37.1 C) (Oral)   Resp 16   Ht 5\' 4"  (1.626 m)   Wt 166 lb 3.2 oz (75.4 kg) Comment: waist circumference = 29"  LMP 03/14/2018   SpO2 100%   BMI 28.53 kg/m    Objective:   Physical Exam  Physical Exam  Constitutional: She is oriented to person, place, and time. She appears well-developed and well-nourished. No distress.  HENT:  Head: Normocephalic and atraumatic.  Right Ear: Tympanic membrane and ear canal normal.  Left Ear: Tympanic membrane and ear canal normal.  Mouth/Throat: Oropharynx is clear and moist.  Eyes: Pupils are equal, round, and reactive to light. No scleral icterus.  Neck: Normal range of motion. No thyromegaly present.  Cardiovascular: Normal rate and regular rhythm.   No murmur heard. Pulmonary/Chest: Effort normal and breath sounds normal. No respiratory distress. He has no wheezes. She has no rales. She exhibits no tenderness.  Abdominal: Soft. Bowel sounds are normal. She exhibits no distension and no mass. There is no tenderness. There is no rebound and no guarding.  Musculoskeletal: She exhibits no edema.  Lymphadenopathy:    She has no cervical adenopathy.  Neurological: She is alert and oriented to person, place, and time. She has normal patellar reflexes. She exhibits normal muscle tone. Coordination normal.  Skin: Skin is warm and dry.  Psychiatric: She has a normal mood and affect. Her behavior is normal. Judgment and thought content normal.  Breasts: Examined lying Right: Without masses, retractions, discharge or axillary adenopathy.  Left: Without masses, retractions, discharge or axillary adenopathy.  Pelvic: deferred           Assessment & Plan:   Preventative care- she has lost a significant amount of weight with healthy diet, exercise. I commended her on this and encouraged her to continue the good work.pap up to date. Obtain routine lab work and STD testing at pt request.   Left great toe fracture-  obtain follow up xray. If still non-healing plan referral to ortho.      Assessment & Plan:

## 2018-04-04 NOTE — Telephone Encounter (Signed)
Pt brings health screening form today. Please call pt once form is complete. Pt wants to submit completed paperwork herself.

## 2018-04-04 NOTE — Patient Instructions (Signed)
Please complete lab work prior to leaving. Complete x-ray on the first floor. Continue healthy diet, exercise.

## 2018-04-05 LAB — HIV ANTIBODY (ROUTINE TESTING W REFLEX): HIV 1&2 Ab, 4th Generation: NONREACTIVE

## 2018-04-05 LAB — URINE CYTOLOGY ANCILLARY ONLY
Chlamydia: NEGATIVE
NEISSERIA GONORRHEA: NEGATIVE
Trichomonas: NEGATIVE

## 2018-04-05 LAB — RPR: RPR Ser Ql: NONREACTIVE

## 2018-04-07 ENCOUNTER — Telehealth: Payer: Self-pay | Admitting: Family

## 2018-04-07 DIAGNOSIS — R7989 Other specified abnormal findings of blood chemistry: Secondary | ICD-10-CM

## 2018-04-07 DIAGNOSIS — R945 Abnormal results of liver function studies: Secondary | ICD-10-CM

## 2018-04-07 DIAGNOSIS — S92406G Nondisplaced unspecified fracture of unspecified great toe, subsequent encounter for fracture with delayed healing: Secondary | ICD-10-CM

## 2018-04-07 NOTE — Telephone Encounter (Signed)
See my chart message

## 2018-04-09 NOTE — Telephone Encounter (Signed)
Nicki Guadalajararicia, see mychart. I left form on your desk.

## 2018-04-10 ENCOUNTER — Encounter: Payer: Self-pay | Admitting: Family

## 2018-04-11 NOTE — Telephone Encounter (Signed)
Patient picked up form yesterday. Copy was made for scanning.

## 2018-04-11 NOTE — Telephone Encounter (Signed)
For was completed and patient picked up yesterday.

## 2018-04-17 ENCOUNTER — Ambulatory Visit (INDEPENDENT_AMBULATORY_CARE_PROVIDER_SITE_OTHER): Payer: Worker's Compensation | Admitting: Family Medicine

## 2018-04-17 ENCOUNTER — Encounter: Payer: Self-pay | Admitting: Family Medicine

## 2018-04-17 VITALS — BP 111/72 | HR 69 | Ht 64.0 in | Wt 169.0 lb

## 2018-04-17 DIAGNOSIS — S92422A Displaced fracture of distal phalanx of left great toe, initial encounter for closed fracture: Secondary | ICD-10-CM | POA: Diagnosis not present

## 2018-04-17 NOTE — Patient Instructions (Signed)
You're doing great. The ultrasound is reassuring. X-rays can lag behind your healing by up to 4 weeks and because you don't have pain, this is about 95% healed at this point. All activities as tolerated without restrictions. Most people will start with about half what you would normally do in the gym and increase by 10% per session to get back up to what you were doing prior to your fracture. Calcium 1300mg  and vitamin D 800 IU daily. Stop wearing the postop shoe. Call me if you're having any problems 6 weeks from now otherwise follow up with me as needed.

## 2018-04-18 ENCOUNTER — Encounter: Payer: Self-pay | Admitting: Family Medicine

## 2018-04-18 NOTE — Progress Notes (Signed)
PCP and consultation requested by: Sandford Craze, NP  Subjective:   HPI: Patient is a 32 y.o. female here for left great toe pain.  Patient reports about 1-1/2 months ago she excellently dropped to 25 pound weight onto her left great toe. She had a lot of pain at the time with swelling and bruising. Pain has mostly improved and is currently at a 0 out of 10 and reports she has to do a lot of walking and activity to feel any pain. She has been using a postop shoe but is transition to sandals now. Not taking anything for pain. No skin changes or numbness.  Past Medical History:  Diagnosis Date  . Anemia   . Genital warts   . Herpes   . History of chicken pox     Current Outpatient Medications on File Prior to Visit  Medication Sig Dispense Refill  . fluticasone (FLONASE) 50 MCG/ACT nasal spray Place 2 sprays into both nostrils daily. 16 g 6  . norethindrone-ethinyl estradiol 1/35 (NORTREL 1/35, 28,) tablet Take 1 tablet by mouth daily. 3 Package 4  . Prenatal Vit-Fe Fumarate-FA (MULTIVITAMIN-PRENATAL) 27-0.8 MG TABS tablet Take 1 tablet by mouth daily at 12 noon.     No current facility-administered medications on file prior to visit.     Past Surgical History:  Procedure Laterality Date  . CHOLECYSTECTOMY  2008    No Known Allergies  Social History   Socioeconomic History  . Marital status: Single    Spouse name: Not on file  . Number of children: Not on file  . Years of education: Not on file  . Highest education level: Not on file  Occupational History  . Not on file  Social Needs  . Financial resource strain: Not on file  . Food insecurity:    Worry: Not on file    Inability: Not on file  . Transportation needs:    Medical: Not on file    Non-medical: Not on file  Tobacco Use  . Smoking status: Never Smoker  . Smokeless tobacco: Never Used  Substance and Sexual Activity  . Alcohol use: Yes    Alcohol/week: 2.0 standard drinks    Types: 2 Standard  drinks or equivalent per week    Comment: social  . Drug use: No  . Sexual activity: Yes    Partners: Male    Birth control/protection: Pill  Lifestyle  . Physical activity:    Days per week: Not on file    Minutes per session: Not on file  . Stress: Not on file  Relationships  . Social connections:    Talks on phone: Not on file    Gets together: Not on file    Attends religious service: Not on file    Active member of club or organization: Not on file    Attends meetings of clubs or organizations: Not on file    Relationship status: Not on file  . Intimate partner violence:    Fear of current or ex partner: Not on file    Emotionally abused: Not on file    Physically abused: Not on file    Forced sexual activity: Not on file  Other Topics Concern  . 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  Hypertension Father   . Cancer Paternal Grandmother        breast  . Cancer Paternal Grandfather        prostate    BP 111/72   Pulse 69   Ht 5\' 4"  (1.626 m)   Wt 169 lb (76.7 kg)   BMI 29.01 kg/m   Review of Systems: See HPI above.     Objective:  Physical Exam:  Gen: NAD, comfortable in exam room  Left foot/ankle: No gross deformity, swelling, ecchymoses FROM ankle, digits including great toe. No TTP including distal phalanx 1st digit. Negative ant drawer and talar tilt.   Negative syndesmotic compression. Thompsons test negative. NV intact distally.  Right foot: No deformity. FROM with 5/5 strength. No tenderness to palpation. NVI distally.   MSK u/s:  No irregularities, edema overlying cortex of 1st digit distal phalanx Assessment & Plan:  1. Left 1st digit distal phalanx fracture - about 7 weeks out from distal phalanx fracture.  Clinically having no pain in this area now  and ultrasound is reassuring.  Independently reviewed radiographs and fracture line still present despite clinical healing - advised patient radiographs can lag behind clinical healing by up to 4 weeks.  Calcium, vitamin D.  Stop postop shoe.  No restrictions on activities.  Call us if she has worsening pain in this area or still having problems in 6 weeks which would be very unlikely.  F/u prn otherwise.

## 2018-04-19 ENCOUNTER — Telehealth: Payer: Self-pay

## 2018-04-19 NOTE — Telephone Encounter (Signed)
Copied from CRM [id number]. Topic: General - Other >> Apr 18, 2018  4:36 PM Tamela Oddi wrote: Reason for CRM: Patient called to speak with the office manager regarding her bill for her visit on 04/04/18.  Patient stated that she called billing and they advised that she speak with the office so that they can separate the bill according to the visit.  Please advise.  CB# [phone].

## 2018-04-19 NOTE — Telephone Encounter (Signed)
Patient is requesting some charges to be billed to [employer] and some charges to be billed to workers comp CB # [phone]

## 2018-04-20 ENCOUNTER — Encounter: Payer: Self-pay | Admitting: Family

## 2018-04-23 NOTE — Telephone Encounter (Signed)
Dawn, please see pt mychart. Do you know how/if we can separate these charges and if there is someone else I need to speak to potentially for this?

## 2018-05-02 ENCOUNTER — Other Ambulatory Visit (INDEPENDENT_AMBULATORY_CARE_PROVIDER_SITE_OTHER): Payer: Managed Care, Other (non HMO)

## 2018-05-02 DIAGNOSIS — R945 Abnormal results of liver function studies: Secondary | ICD-10-CM

## 2018-05-02 DIAGNOSIS — R7989 Other specified abnormal findings of blood chemistry: Secondary | ICD-10-CM

## 2018-05-02 LAB — HEPATIC FUNCTION PANEL
ALBUMIN: 3.8 g/dL (ref 3.5–5.2)
ALT: 24 U/L (ref 0–35)
AST: 20 U/L (ref 0–37)
Alkaline Phosphatase: 33 U/L — ABNORMAL LOW (ref 39–117)
Bilirubin, Direct: 0.1 mg/dL (ref 0.0–0.3)
Total Bilirubin: 0.5 mg/dL (ref 0.2–1.2)
Total Protein: 6.6 g/dL (ref 6.0–8.3)

## 2018-06-05 ENCOUNTER — Other Ambulatory Visit: Payer: Self-pay | Admitting: Family

## 2019-03-09 ENCOUNTER — Encounter: Payer: Self-pay | Admitting: Family

## 2019-04-29 ENCOUNTER — Other Ambulatory Visit: Payer: Self-pay

## 2019-04-30 ENCOUNTER — Other Ambulatory Visit: Payer: Self-pay

## 2019-04-30 ENCOUNTER — Ambulatory Visit (INDEPENDENT_AMBULATORY_CARE_PROVIDER_SITE_OTHER): Payer: Managed Care, Other (non HMO) | Admitting: Family

## 2019-04-30 ENCOUNTER — Encounter: Payer: Self-pay | Admitting: Family

## 2019-04-30 VITALS — BP 119/74 | HR 65 | Temp 96.5°F | Resp 16 | Ht 64.5 in | Wt 188.0 lb

## 2019-04-30 DIAGNOSIS — Z0000 Encounter for general adult medical examination without abnormal findings: Secondary | ICD-10-CM | POA: Diagnosis not present

## 2019-04-30 DIAGNOSIS — Z113 Encounter for screening for infections with a predominantly sexual mode of transmission: Secondary | ICD-10-CM | POA: Diagnosis not present

## 2019-04-30 MED ORDER — NORTREL 1/35 (28) 1-35 MG-MCG PO TABS: 1.0000 | ORAL_TABLET | Freq: Every day | ORAL | 4 refills | Status: DC

## 2019-04-30 NOTE — Progress Notes (Signed)
Subjective:    Patient ID: Erin English, female    DOB: [birth year], 33 y.o.   MRN: [id number]  HPI  Patient presents today for complete physical.  Immunizations: declines flu shot, 2015 tdap Diet: eating healthy Wt Readings from Last 3 Encounters:  04/30/19 188 lb (85.3 kg)  04/17/18 169 lb (76.7 kg)  04/04/18 166 lb 3.2 oz (75.4 kg)  Exercise: ellipitical, beach body, some weight training Pap Smear:2018 Dental: up to date Vision: 2018   Review of Systems  Constitutional: Positive for unexpected weight change.  HENT: Negative for hearing loss and rhinorrhea.   Eyes: Negative for visual disturbance.  Respiratory: Negative for cough and shortness of breath.   Cardiovascular: Negative for chest pain.  Gastrointestinal: Negative for constipation (reports occasional constipation, uses senna ) and diarrhea.  Genitourinary: Negative for dysuria, frequency, hematuria and menstrual problem.  Musculoskeletal: Negative for arthralgias and myalgias.  Skin: Negative for rash.  Neurological: Negative for headaches.  Hematological: Negative for adenopathy.  Psychiatric/Behavioral:       Denies depression/anxiety   Past Medical History:  Diagnosis Date  . Anemia   . Genital warts   . Herpes   . History of chicken pox      Social History   Socioeconomic History  . Marital status: Single    Spouse name: Not on file  . Number of children: Not on file  . Years of education: Not on file  . Highest education level: Not on file  Occupational History  . Not on file  Social Needs  . Financial resource strain: Not on file  . Food insecurity    Worry: Not on file    Inability: Not on file  . Transportation needs    Medical: Not on file    Non-medical: Not on file  Tobacco Use  . Smoking status: Never Smoker  . Smokeless tobacco: Never Used  Substance and Sexual Activity  . Alcohol use: Yes    Alcohol/week: 2.0 standard drinks    Types: 2 Standard drinks or equivalent per  week    Comment: social  . Drug use: No  . Sexual activity: Yes    Partners: Male    Birth control/protection: Pill  Lifestyle  . Physical activity    Days per week: Not on file    Minutes per session: Not on file  . Stress: Not on file  Relationships  . Social Musician on phone: Not on file    Gets together: Not on file    Attends religious service: Not on file    Active member of club or organization: Not on file    Attends meetings of clubs or organizations: Not on file    Relationship status: Not on file  . Intimate partner violence    Fear of current or ex partner: Not on file    Emotionally abused: Not on file    Physically abused: Not on file    Forced sexual activity: Not on file  Other Topics Concern  . 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 CHOLECYSTECTOMY  2008    Family History  Problem Relation Age of Onset  . Hypertension Father   . Cancer Paternal Grandmother  breast  . Cancer Paternal Grandfather        prostate    No Known Allergies  Current Outpatient Medications on File Prior to Visit  Medication Sig Dispense Refill  . Multiple Vitamins-Minerals (MULTIVITAMIN ADULTS) TABS     . norethindrone-ethinyl estradiol 1/35 (NORTREL 1/35, 28,) tablet Take 1 tablet by mouth daily. 3 Package 4   No current facility-administered medications on file prior to visit.     BP 119/74 (BP Location: Right Arm, Patient Position: Sitting, Cuff Size: Small)   Pulse 65   Temp (!) 96.5 F (35.8 C) (Temporal)   Resp 16   Ht 5' 4.5" (1.638 m)   Wt 188 lb (85.3 kg)   LMP 04/16/2019   SpO2 100%   BMI 31.77 kg/m       Objective:   Physical Exam  Physical Exam  Constitutional: She is oriented to person, place, and time. She appears well-developed and  well-nourished. No distress.  HENT:  Head: Normocephalic and atraumatic.  Right Ear: Tympanic membrane and ear canal normal.  Left Ear: Tympanic membrane and ear canal normal.  Mouth/Throat:Not examined- pt wearing a mask Eyes: Pupils are equal, round, and reactive to light. No scleral icterus.  Neck: Normal range of motion. No thyromegaly present.  Cardiovascular: Normal rate and regular rhythm.   No murmur heard. Pulmonary/Chest: Effort normal and breath sounds normal. No respiratory distress. He has no wheezes. She has no rales. She exhibits no tenderness.  Abdominal: Soft. Bowel sounds are normal. She exhibits no distension and no mass. There is no tenderness. There is no rebound and no guarding.  Musculoskeletal: She exhibits no edema.  Lymphadenopathy:    She has no cervical adenopathy.  Neurological: She is alert and oriented to person, place, and time. She has normal patellar reflexes. She exhibits normal muscle tone. Coordination normal.  Skin: Skin is warm and dry.  Psychiatric: She has a normal mood and affect. Her behavior is normal. Judgment and thought content normal.  Breasts: Examined lying Right: Without masses, retractions, discharge or axillary adenopathy.       Assessment & Plan:    Preventative care- She has gained weight this year. We discussed healthy diet, exercise and weight loss.  Declines flu shot.  Will obtain routine lab work including TSH, given weight gain.      Assessment & Plan:

## 2019-04-30 NOTE — Patient Instructions (Signed)
Please complete lab work prior to leaving. Work on healthy diet, exercise and weight loss.  

## 2019-05-01 LAB — HEPATIC FUNCTION PANEL
AG Ratio: 1.5 (calc) (ref 1.0–2.5)
ALT: 23 U/L (ref 6–29)
AST: 22 U/L (ref 10–30)
Albumin: 3.9 g/dL (ref 3.6–5.1)
Alkaline phosphatase (APISO): 36 U/L (ref 31–125)
Bilirubin, Direct: 0.1 mg/dL (ref 0.0–0.2)
Globulin: 2.6 g/dL (calc) (ref 1.9–3.7)
Indirect Bilirubin: 0.4 mg/dL (calc) (ref 0.2–1.2)
Total Bilirubin: 0.5 mg/dL (ref 0.2–1.2)
Total Protein: 6.5 g/dL (ref 6.1–8.1)

## 2019-05-01 LAB — TSH: TSH: 2.62 mIU/L

## 2019-05-01 LAB — BASIC METABOLIC PANEL
BUN: 10 mg/dL (ref 7–25)
CO2: 23 mmol/L (ref 20–32)
Calcium: 8.8 mg/dL (ref 8.6–10.2)
Chloride: 104 mmol/L (ref 98–110)
Creat: 0.9 mg/dL (ref 0.50–1.10)
Glucose, Bld: 85 mg/dL (ref 65–99)
Potassium: 4.4 mmol/L (ref 3.5–5.3)
Sodium: 135 mmol/L (ref 135–146)

## 2019-05-01 LAB — LIPID PANEL
Cholesterol: 156 mg/dL (ref <200)
HDL: 50 mg/dL (ref >=50)
LDL Cholesterol (Calc): 89 mg/dL (calc)
Non-HDL Cholesterol (Calc): 106 mg/dL (calc) (ref <130)
Total CHOL/HDL Ratio: 3.1 (calc) (ref <5.0)
Triglycerides: 84 mg/dL (ref <150)

## 2019-05-01 LAB — CBC WITH DIFFERENTIAL/PLATELET
Absolute Monocytes: 297 cells/uL (ref 200–950)
Basophils Absolute: 22 cells/uL (ref 0–200)
Basophils Relative: 0.4 %
Eosinophils Absolute: 78 cells/uL (ref 15–500)
Eosinophils Relative: 1.4 %
HCT: 38 % (ref 35.0–45.0)
Hemoglobin: 12.8 g/dL (ref 11.7–15.5)
Lymphs Abs: 1876 cells/uL (ref 850–3900)
MCH: 32 pg (ref 27.0–33.0)
MCHC: 33.7 g/dL (ref 32.0–36.0)
MCV: 95 fL (ref 80.0–100.0)
MPV: 12.2 fL (ref 7.5–12.5)
Monocytes Relative: 5.3 %
Neutro Abs: 3326 cells/uL (ref 1500–7800)
Neutrophils Relative %: 59.4 %
Platelets: 255 10*3/uL (ref 140–400)
RBC: 4 10*6/uL (ref 3.80–5.10)
RDW: 11.9 % (ref 11.0–15.0)
Total Lymphocyte: 33.5 %
WBC: 5.6 10*3/uL (ref 3.8–10.8)

## 2019-05-01 LAB — RPR: RPR Ser Ql: NONREACTIVE

## 2019-05-01 LAB — HIV ANTIBODY (ROUTINE TESTING W REFLEX): HIV 1&2 Ab, 4th Generation: NONREACTIVE

## 2019-05-05 ENCOUNTER — Other Ambulatory Visit: Payer: Self-pay | Admitting: Family

## 2019-05-06 ENCOUNTER — Telehealth: Payer: Self-pay | Admitting: Family

## 2019-05-06 LAB — SURESWAB, VAGINOSIS/VAGINITIS PLUS
Atopobium vaginae: NOT DETECTED Log cells/mL
C. albicans, DNA: NOT DETECTED
C. glabrata, DNA: NOT DETECTED
C. parapsilosis, DNA: DETECTED — AB
C. trachomatis RNA, TMA: NOT DETECTED
C. tropicalis, DNA: NOT DETECTED
Gardnerella vaginalis: NOT DETECTED Log cells/mL
LACTOBACILLUS SPECIES: 7.6 Log (cells/mL)
MEGASPHAERA SPECIES: NOT DETECTED Log cells/mL
N. gonorrhoeae RNA, TMA: NOT DETECTED
Trichomonas vaginalis RNA: NOT DETECTED

## 2019-05-06 NOTE — Telephone Encounter (Signed)
Rod Holler, can you please call the lab and check the status of the vaginal swab?

## 2019-05-06 NOTE — Telephone Encounter (Signed)
Noted  

## 2019-05-06 NOTE — Telephone Encounter (Signed)
Per [facility] diagnostic test will be completed no lather than Wednesday 05-08-2019.

## 2019-05-07 ENCOUNTER — Telehealth: Payer: Self-pay | Admitting: Family

## 2019-05-07 MED ORDER — FLUCONAZOLE 150 MG PO TABS: 150.0000 mg | ORAL_TABLET | Freq: Once | ORAL | 0 refills | Status: AC

## 2019-05-07 NOTE — Telephone Encounter (Signed)
Vaginal swab negative for gonorrhea/chlamydia or trich.  Note is made of + yeast. I sent an rx to her pharmacy for diflucan.

## 2019-05-07 NOTE — Telephone Encounter (Signed)
Patient advised of results and prescription sent

## 2019-06-15 IMAGING — DX DG TOE GREAT 2+V*L*
3 series · 3 of 3 positions shown · non-contrast
Comparison: Great toe series of March 23, 2018

CLINICAL DATA: Follow-up examination of a known fracture of the
distal phalanx of the great toe.

EXAM:
LEFT GREAT TOE

[toe ap]
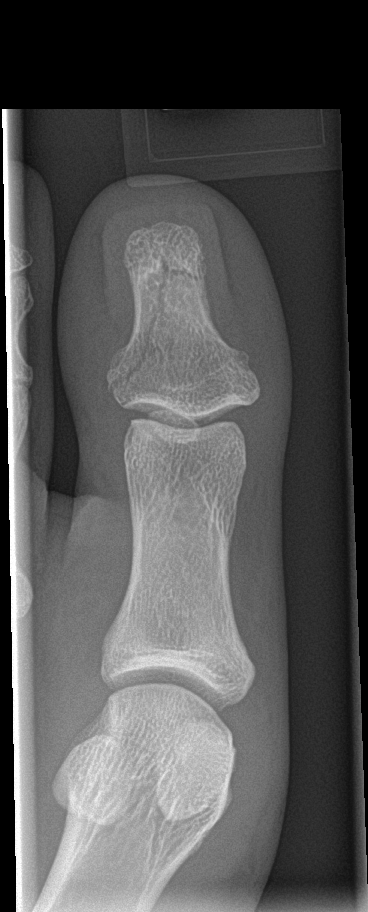

[toe obl]
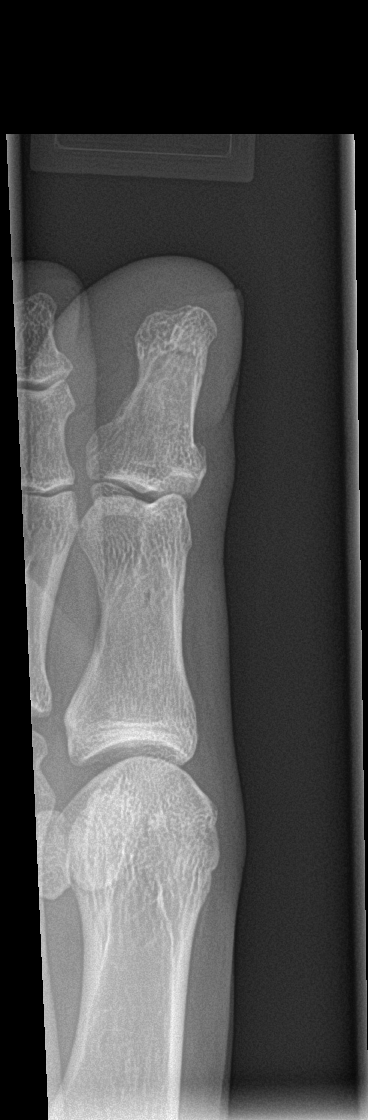

[toe lat]
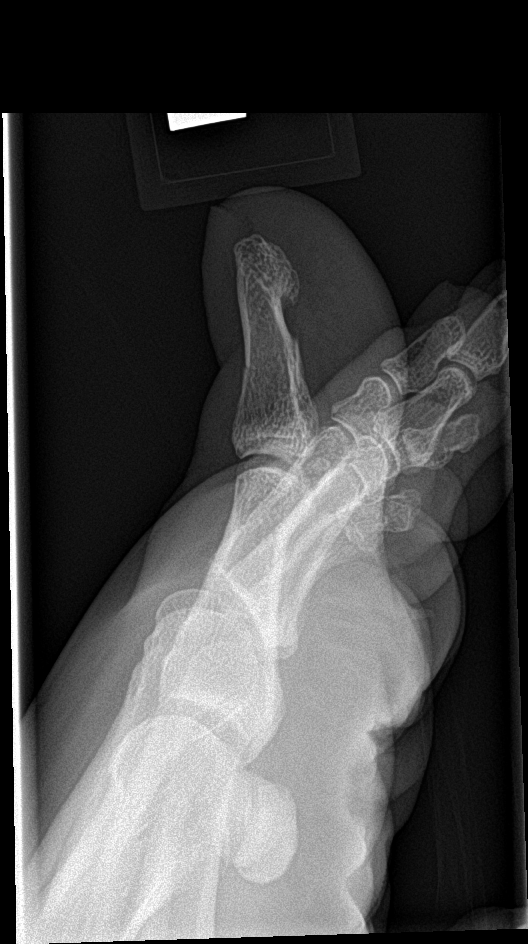

[3 of 3 positions shown; findings below may reference images not displayed]

FINDINGS: Again demonstrated is a fracture both longitudinally and
transversely oriented through the distal phalanx. Alignment remains
anatomic. There is no significant periosteal reaction. The IP joint
and the proximal phalanx are normal in appearance.
IMPRESSION: Stable appearance of the nondisplaced fracture through the distal
phalanx of the great toe.

## 2020-03-09 ENCOUNTER — Encounter: Payer: Self-pay | Admitting: Family

## 2020-05-11 ENCOUNTER — Other Ambulatory Visit: Payer: Self-pay

## 2020-05-11 ENCOUNTER — Ambulatory Visit (INDEPENDENT_AMBULATORY_CARE_PROVIDER_SITE_OTHER): Payer: Managed Care, Other (non HMO) | Admitting: Family

## 2020-05-11 ENCOUNTER — Encounter: Payer: Self-pay | Admitting: Family

## 2020-05-11 VITALS — BP 120/93 | HR 104 | Temp 98.6°F | Resp 16 | Ht 64.0 in | Wt 199.2 lb

## 2020-05-11 DIAGNOSIS — Z0000 Encounter for general adult medical examination without abnormal findings: Secondary | ICD-10-CM | POA: Diagnosis not present

## 2020-05-11 DIAGNOSIS — Z01419 Encounter for gynecological examination (general) (routine) without abnormal findings: Secondary | ICD-10-CM

## 2020-05-11 DIAGNOSIS — Z113 Encounter for screening for infections with a predominantly sexual mode of transmission: Secondary | ICD-10-CM | POA: Diagnosis not present

## 2020-05-11 MED ORDER — NORTREL 1/35 (28) 1-35 MG-MCG PO TABS: 1.0000 | ORAL_TABLET | Freq: Every day | ORAL | 4 refills | Status: DC

## 2020-05-11 NOTE — Progress Notes (Signed)
Subjective:    Patient ID: Erin English, female    DOB: 07-17-1986, 34 y.o.   MRN: [id number]  HPI  Patient presents today for complete physical.  Immunizations: Declines flu shot today. Tetanus is up to date.  Declines flu shot Diet: works from home [employer] from Last 3 Encounters:  05/11/20 199 lb 3.2 oz (90.4 kg)  04/30/19 188 lb (85.3 kg)  04/17/18 169 lb (76.7 kg)  Exercise: enjoys elliptical.  Home videos Pap Smear: 9/18 Dental: up to date Vision: due- will schedule  Review of Systems  Constitutional: Negative for unexpected weight change.  HENT: Negative for hearing loss and rhinorrhea.   Eyes: Negative for visual disturbance.  Respiratory: Negative for cough and shortness of breath.   Cardiovascular: Negative for chest pain.  Gastrointestinal: Negative for constipation and diarrhea.  Genitourinary: Negative for dysuria, frequency, hematuria and menstrual problem.  Musculoskeletal: Negative for arthralgias and myalgias.  Skin: Negative for rash.  Neurological: Negative for headaches.  Hematological: Negative for adenopathy.  Psychiatric/Behavioral:       Denies depression/anxiety   Past Medical History:  Diagnosis Date  . Anemia   . Genital warts   . Herpes   . History of chicken pox      Social History   Socioeconomic History  . Marital status: Single    Spouse name: Not on file  . Number of children: Not on file  . Years of education: Not on file  . Highest education level: Not on file  Occupational History  . Not on file  Tobacco Use  . Smoking status: Never Smoker  . Smokeless tobacco: Never Used  Substance and Sexual Activity  . Alcohol use: Yes    Alcohol/week: 2.0 standard drinks    Types: 2 Standard drinks or equivalent per week    Comment: social  . Drug use: No  . Sexual activity: Yes    Partners: Male    Birth control/protection: Pill  Other Topics Concern  . 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 Difficulty of Paying Living Expenses: Not on file  Food Insecurity:   . Worried About Programme researcher, broadcasting/film/video in the Last Year: Not on file  . Ran Out of Food in the Last Year: Not on file  Transportation Needs:   . Lack of Transportation (Medical): Not on file  . Lack of Transportation (Non-Medical): Not on file  Physical Activity:   . Days of Exercise per Week: Not on file  . Minutes of Exercise per Session: Not on file  Stress:   . Feeling of Stress : Not on file  Social Connections:   . Frequency of Communication with Friends and Family: Not on file  . Frequency of Social Gatherings with Friends and Family: Not on file  . Attends Religious Services: Not on file  . Active Member of Clubs or Organizations: Not on file  . Attends Banker Meetings: Not on file  . Marital Status: Not on file  Intimate Partner Violence:   . Fear of Current or Ex-Partner: Not on file  . Emotionally Abused: Not on file  . Physically Abused: Not on file  . Sexually Abused: Not on file    Past Surgical History:  Procedure Laterality Date  . CHOLECYSTECTOMY  2008    Family History  Problem Relation Age of Onset  . Hypertension Father   . Cancer Paternal Grandmother        breast  . Cancer Paternal Grandfather        prostate    No Known Allergies  Current Outpatient Medications on File Prior to Visit  Medication Sig Dispense Refill  . Multiple Vitamins-Minerals (MULTIVITAMIN ADULTS) TABS     . NORTREL 1/35, 28, tablet TAKE 1 TABLET BY MOUTH DAILY 84 tablet 1  . phentermine (ADIPEX-P) 37.5 MG tablet Take by mouth.    . tacrolimus (PROTOPIC) 0.1 % ointment Apply to the face twice daily     No current facility-administered medications on file prior to visit.    BP (!) 120/93 (BP Location: Left Arm, Patient  Position: Sitting, Cuff Size: Small)   Pulse (!) 104   Temp 98.6 F (37 C) (Oral)   Resp 16   Ht 5\' 4"  (1.626 m)   Wt 199 lb 3.2 oz (90.4 kg)   SpO2 100%   BMI 34.19 kg/m       Objective:   Physical Exam  Physical Exam  Constitutional: She is oriented to person, place, and time. She appears well-developed and well-nourished. No distress.  HENT:  Head: Normocephalic and atraumatic.  Right Ear: Tympanic membrane and ear canal normal.  Left Ear: Tympanic membrane and ear canal normal.  Mouth/Throat: not examined Eyes: Pupils are equal, round, and reactive to light. No scleral icterus.  Neck: Normal range of motion. No thyromegaly present.  Cardiovascular: Normal rate and regular rhythm.   No murmur heard. Pulmonary/Chest: Effort normal and breath sounds normal. No respiratory distress. He has no wheezes. She has no rales. She exhibits no tenderness.  Abdominal: Soft. Bowel sounds are normal. She exhibits no distension and no mass. There is no tenderness. There is no rebound and no guarding.  Musculoskeletal: She exhibits no edema.  Lymphadenopathy:    She has no cervical adenopathy.  Neurological: She is alert and oriented to person, place, and time. She has normal patellar reflexes. She exhibits normal muscle tone. Coordination normal.  Skin: Skin is warm and dry.  Psychiatric: She has a normal mood and affect. Her behavior is normal. Judgment and thought content normal.  Breasts: Examined lying Right: Without masses, retractions, discharge or axillary adenopathy.  Left: Without masses, retractions, discharge or axillary adenopathy.  Inguinal/mons: Normal without inguinal adenopathy  External genitalia: Normal  BUS/Urethra/Skene's glands: Normal  Bladder: Normal  Vagina: Normal  Cervix: Normal  Uterus: normal in size, shape and contour. Midline and mobile  Adnexa/parametria:  Rt: Without masses or tenderness.  Lt: Without masses or tenderness.  Anus and perineum:  Normal            Assessment & Plan:   Preventative care- encouraged her to continue healthy diet, exercise and weight loss efforts. Pap performed today. She is also requesting STD screening. Denies any symptoms. Declines flu shot. Pfizer up to date.   Elevated blood pressure reading- Likely due to Adipex which she is receiving from another provider. I advised her that I think it would be safest that she discontinue the adipex.   This visit occurred during the SARS-CoV-2 public health emergency.  Safety protocols were in place, including screening questions prior to the visit, additional usage of staff PPE, and extensive cleaning of exam room while observing appropriate contact time as indicated for disinfecting solutions.  Assessment & Plan:

## 2020-05-11 NOTE — Patient Instructions (Signed)
Please complete lab work prior to leaving. Keep up the good work with healthy diet, exercise and weight loss.  

## 2020-05-12 LAB — PAP, TP IMAGING W/ HPV RNA, RFLX HPV TYPE 16,18/45: HPV DNA High Risk: NOT DETECTED

## 2020-05-14 LAB — RPR+HSVIGM+HBSAG+HSV2(IGG)+...
HIV Screen 4th Generation wRfx: NONREACTIVE
HSV 2 IgG, Type Spec: 5.8 index — ABNORMAL HIGH (ref 0.00–0.90)
HSVI/II Comb IgM: <0.91 Ratio (ref 0.00–0.90)
Hepatitis B Surface Ag: NEGATIVE
RPR Ser Ql: NONREACTIVE

## 2020-05-15 ENCOUNTER — Encounter: Payer: Self-pay | Admitting: Family

## 2020-05-15 MED ORDER — FLUCONAZOLE 150 MG PO TABS: 150.0000 mg | ORAL_TABLET | Freq: Once | ORAL | 0 refills | Status: AC

## 2020-05-15 MED ORDER — NITROFURANTOIN MONOHYD MACRO 100 MG PO CAPS: 100.0000 mg | ORAL_CAPSULE | Freq: Two times a day (BID) | ORAL | 0 refills | Status: DC

## 2020-05-17 LAB — SURESWAB, VAGINOSIS/VAGINITIS PLUS
Atopobium vaginae: NOT DETECTED Log (cells/mL)
C. albicans, DNA: NOT DETECTED
C. glabrata, DNA: NOT DETECTED
C. parapsilosis, DNA: NOT DETECTED
C. trachomatis RNA, TMA: NOT DETECTED
C. tropicalis, DNA: NOT DETECTED
Gardnerella vaginalis: NOT DETECTED Log (cells/mL)
LACTOBACILLUS SPECIES: 6.6 Log (cells/mL)
MEGASPHAERA SPECIES: NOT DETECTED Log (cells/mL)
N. gonorrhoeae RNA, TMA: NOT DETECTED
Trichomonas vaginalis RNA: NOT DETECTED

## 2020-05-18 ENCOUNTER — Telehealth: Payer: Self-pay | Admitting: Family

## 2020-05-18 MED ORDER — FLUCONAZOLE 150 MG PO TABS: ORAL_TABLET | ORAL | 0 refills | Status: DC

## 2020-05-18 NOTE — Telephone Encounter (Signed)
See mychart.  

## 2020-05-18 NOTE — Telephone Encounter (Signed)
See rx. 

## 2021-05-26 ENCOUNTER — Encounter: Payer: Self-pay | Admitting: Family

## 2021-05-26 ENCOUNTER — Other Ambulatory Visit: Payer: Self-pay

## 2021-05-26 ENCOUNTER — Ambulatory Visit (INDEPENDENT_AMBULATORY_CARE_PROVIDER_SITE_OTHER): Payer: Managed Care, Other (non HMO) | Admitting: Family

## 2021-05-26 VITALS — BP 135/71 | HR 81 | Temp 97.7°F | Resp 16 | Ht 64.0 in | Wt 235.0 lb

## 2021-05-26 DIAGNOSIS — Z0000 Encounter for general adult medical examination without abnormal findings: Secondary | ICD-10-CM | POA: Diagnosis not present

## 2021-05-26 DIAGNOSIS — Z113 Encounter for screening for infections with a predominantly sexual mode of transmission: Secondary | ICD-10-CM

## 2021-05-26 DIAGNOSIS — R635 Abnormal weight gain: Secondary | ICD-10-CM

## 2021-05-26 LAB — TSH: TSH: 2.41 u[IU]/mL (ref 0.35–5.50)

## 2021-05-26 LAB — T4, FREE: Free T4: 0.79 ng/dL (ref 0.60–1.60)

## 2021-05-26 NOTE — Assessment & Plan Note (Addendum)
Wt Readings from Last 3 Encounters:  05/26/21 235 lb (106.6 kg)  05/11/20 199 lb 3.2 oz (90.4 kg)  04/30/19 188 lb (85.3 kg)   She has had significant weight gain. Discussed removing juices, sugared beverages. Preparing more food freshly at home, avoiding fast food and increasing exercise. Pap up to date, declines flu shot. Requesting STD screening.

## 2021-05-26 NOTE — Addendum Note (Signed)
Addended by: Mervin Kung A on: 05/26/2021 10:13 AM   Modules accepted: Orders

## 2021-05-26 NOTE — Progress Notes (Signed)
Subjective:   By signing my name below, I, Erin English, attest that this documentation has been prepared under the direction and in the presence of Erin English. 05/26/2021    Patient ID: Erin English, female    DOB: July 07, 1986, 35 y.o.   MRN: [id number]  Chief Complaint  Patient presents with   Annual Exam    Will like HIV and STD testing, no symptoms.    HPI Patient is in today for a comprehensive physical exam.   CPE She denies having any unexpected weight change, ear pain, hearing loss and rhinorrhea, visual disturbance, cough, chest pain and leg swelling, nausea, vomiting, diarrhea, constipation, blood in stool, or dysuria and frequency, for myalgias and arthralgias, rash, headaches, adenopathy, depression or anxiety at this time.  Social  history- She has no recent surgical procedures. She has no recent changes to her family medical history. She does not drink alcohol. She does not use tobacco or vaping products. She does not use drugs. She has one female partner and uses birth control pills.  Immunizations: She is not interested in receiving a flu vaccine during this visit. She has 2 pfizer Covid-19 vaccines. She was informed of he bivalent Covid-19 vaccine. She is interested in receiving a HIV and STD testing during her next lab work.  Diet: She has gained weight since her last physical exam. She has not exercised or changed her diet since last year. She does not typically eat breakfast and starts eating after 12. She typically eats chick-fil-A, subway, or homemade sandwiches. She typically drinks water, apple juice, or ginger ale throughout the day. She typically eats apples or twix candy for snacks.   Wt Readings from Last 3 Encounters:  05/26/21 235 lb (106.6 kg)  05/11/20 199 lb 3.2 oz (90.4 kg)  04/30/19 188 lb (85.3 kg)   Exercise: She does not participate in regular exercise.  Pap Smear: Last completed 05/11/2020. Dental: She is UTD on dental care.  Vision:  She does not see a optometrist at this time.   Health Maintenance Due  Topic Date Due   Hepatitis C Screening  Never done   COVID-19 Vaccine (3 - Booster for Pfizer series) 05/27/2020    Past Medical History:  Diagnosis Date   Anemia    Genital warts    Herpes    History of chicken pox     Past Surgical History:  Procedure Laterality Date   CHOLECYSTECTOMY  2008    Family History  Problem Relation Age of Onset   Hypertension Father    Cancer Paternal Grandmother        breast   Cancer Paternal Grandfather        prostate    Social History   Socioeconomic History   Marital status: Single    Spouse name: Not on file   Number of children: Not on file   Years of education: Not on file   Highest education level: Not on file  Occupational History   Not on file  Tobacco Use   Smoking status: Never   Smokeless tobacco: Never  Substance and Sexual Activity   Alcohol use: Yes    Alcohol/week: 2.0 standard drinks    Types: 2 Standard drinks or equivalent per week    Comment: social   Drug use: No   Sexual activity: Yes    Partners: Male    Birth control/protection: Pill  Other Topics Concern   Not on file  Social History Narrative   Single  Lives alone   Works in Press photographer for [employer]   Enjoys sleeping, sewing, cooking   No pets, non-smoker   Social Determinants of Radio broadcast assistant Strain: Not on file  Food Insecurity: Not on file  Transportation Needs: Not on file  Physical Activity: Not on file  Stress: Not on file  Social Connections: Not on file  Intimate Partner Violence: Not on file    Outpatient Medications Prior to Visit  Medication Sig Dispense Refill   Multiple Vitamins-Minerals (MULTIVITAMIN ADULTS) TABS      norethindrone-ethinyl estradiol 1/35 (NORTREL 1/35, 28,) tablet Take 1 tablet by mouth daily. 84 tablet 4   tacrolimus (PROTOPIC) 0.1 % ointment Apply to the face twice daily     fluconazole (DIFLUCAN)  150 MG tablet Take 1 tablet by mouth today, may repeat in 3 days 2 tablet 0   phentermine (ADIPEX-P) 37.5 MG tablet Take by mouth.     No facility-administered medications prior to visit.    No Known Allergies  Review of Systems  Constitutional:        (-)unexpected weight change (-)Adenopathy  HENT:  Negative for hearing loss.        (-)Rhinorrhea   Eyes:        (-)Visual disturbance  Respiratory:  Negative for cough.   Cardiovascular:  Negative for chest pain and leg swelling.  Gastrointestinal:  Negative for blood in stool, constipation, diarrhea, nausea and vomiting.  Genitourinary:  Negative for dysuria and frequency.  Musculoskeletal:  Negative for joint pain and myalgias.  Skin:  Negative for rash.  Neurological:  Negative for headaches.  Psychiatric/Behavioral:  Negative for depression. The patient is not nervous/anxious.       Objective:    Physical Exam Constitutional:      General: She is not in acute distress.    Appearance: Normal appearance. She is not ill-appearing.  HENT:     Head: Normocephalic and atraumatic.     Right Ear: Tympanic membrane, ear canal and external ear normal.     Left Ear: Tympanic membrane, ear canal and external ear normal.  Eyes:     Extraocular Movements: Extraocular movements intact.     Pupils: Pupils are equal, round, and reactive to light.     Comments: No nystagmus  Cardiovascular:     Rate and Rhythm: Normal rate and regular rhythm.     Heart sounds: Normal heart sounds. No murmur heard.   No gallop.  Pulmonary:     Effort: Pulmonary effort is normal. No respiratory distress.     Breath sounds: Normal breath sounds. No wheezing or rales.  Chest:  Breasts:    Right: Normal.     Left: Normal.  Abdominal:     General: There is no distension.     Palpations: Abdomen is soft.     Tenderness: There is no abdominal tenderness. There is no guarding.  Musculoskeletal:     Comments: 5/5 strength in both upper and lower  extremities  Skin:    General: Skin is warm and dry.  Neurological:     Mental Status: She is alert and oriented to person, place, and time.     Deep Tendon Reflexes:     Reflex Scores:      Patellar reflexes are 2+ on the right side and 2+ on the left side. Psychiatric:        Behavior: Behavior normal.        Judgment: Judgment normal.  BP 135/71 (BP Location: Right Arm, Patient Position: Sitting, Cuff Size: Large)   Pulse 81   Temp 97.7 F (36.5 C) (Oral)   Resp 16   Ht 5\' 4"  (1.626 m)   Wt 235 lb (106.6 kg)   SpO2 100%   BMI 40.34 kg/m  Wt Readings from Last 3 Encounters:  05/26/21 235 lb (106.6 kg)  05/11/20 199 lb 3.2 oz (90.4 kg)  04/30/19 188 lb (85.3 kg)       Assessment & Plan:   Problem List Items Addressed This Visit       Unprioritized   Preventative health care    Wt Readings from Last 3 Encounters:  05/26/21 235 lb (106.6 kg)  05/11/20 199 lb 3.2 oz (90.4 kg)  04/30/19 188 lb (85.3 kg)        Other Visit Diagnoses     Weight gain    -  Primary   Relevant Orders   TSH   T4, free   Screening examination for STD (sexually transmitted disease)       Relevant Orders   HIV antibody (with reflex)   Hepatitis C Antibody   RPR        No orders of the defined types were placed in this encounter.   I, Debbrah Alar English, personally preformed the services described in this documentation.  All medical record entries made by the scribe were at my direction and in my presence.  I have reviewed the chart and discharge instructions (if applicable) and agree that the record reflects my personal performance and is accurate and complete. 05/26/2021   I,Erin English,acting as a Education administrator for Nance Pear, English.,have documented all relevant documentation on the behalf of Nance Pear, English,as directed by  Nance Pear, English while in the presence of Nance Pear, English.   Nance Pear, English

## 2021-05-26 NOTE — Patient Instructions (Addendum)
Please schedule a routine eye exam.  Please complete lab work prior to leaving. Please continue your work on healthy diet, exercise and weight loss.

## 2021-05-27 LAB — HEPATITIS C ANTIBODY
Hepatitis C Ab: NONREACTIVE
SIGNAL TO CUT-OFF: 0.03 (ref <1.00)

## 2021-05-27 LAB — SURESWAB® ADVANCED VAGINITIS PLUS,TMA
C. trachomatis RNA, TMA: NOT DETECTED
CANDIDA SPECIES: NOT DETECTED
Candida glabrata: NOT DETECTED
N. gonorrhoeae RNA, TMA: NOT DETECTED
SURESWAB(R) ADV BACTERIAL VAGINOSIS(BV),TMA: NEGATIVE
TRICHOMONAS VAGINALIS (TV),TMA: NOT DETECTED

## 2021-05-27 LAB — HIV ANTIBODY (ROUTINE TESTING W REFLEX): HIV 1&2 Ab, 4th Generation: NONREACTIVE

## 2021-05-27 LAB — RPR: RPR Ser Ql: NONREACTIVE

## 2021-05-30 ENCOUNTER — Encounter: Payer: Self-pay | Admitting: Family

## 2021-05-31 ENCOUNTER — Other Ambulatory Visit: Payer: Self-pay

## 2021-05-31 MED ORDER — NORTREL 1/35 (28) 1-35 MG-MCG PO TABS: 1.0000 | ORAL_TABLET | Freq: Every day | ORAL | 4 refills | Status: DC

## 2021-06-03 ENCOUNTER — Other Ambulatory Visit: Payer: Self-pay | Admitting: Family

## 2022-06-01 ENCOUNTER — Encounter: Payer: Managed Care, Other (non HMO) | Admitting: Family

## 2022-06-01 ENCOUNTER — Encounter: Payer: Self-pay | Admitting: Family

## 2022-06-01 ENCOUNTER — Ambulatory Visit (INDEPENDENT_AMBULATORY_CARE_PROVIDER_SITE_OTHER): Payer: Managed Care, Other (non HMO) | Admitting: Family

## 2022-06-01 VITALS — BP 137/83 | HR 74 | Resp 18 | Ht 64.0 in | Wt 239.0 lb

## 2022-06-01 DIAGNOSIS — Z0000 Encounter for general adult medical examination without abnormal findings: Secondary | ICD-10-CM | POA: Diagnosis not present

## 2022-06-01 DIAGNOSIS — Z113 Encounter for screening for infections with a predominantly sexual mode of transmission: Secondary | ICD-10-CM

## 2022-06-01 MED ORDER — NORTREL 1/35 (28) 1-35 MG-MCG PO TABS: 1.0000 | ORAL_TABLET | Freq: Every day | ORAL | 4 refills | Status: DC

## 2022-06-01 NOTE — Assessment & Plan Note (Addendum)
Discussed healthy diet and regular exercise. Recommended that she get covid vaccine at her pharmacy. Pap up to date.  Declines flu shot. Tetanus up to date. BP is borderline

## 2022-06-01 NOTE — Progress Notes (Signed)
Subjective:   By signing my name below, I, Erin English, attest that this documentation has been prepared under the direction and in the presence of [name], NP 06/01/2022   Patient ID: Erin English, female    DOB: 10/28/1985, 36 y.o.   MRN: [id number]  Chief Complaint  Patient presents with   Annual Exam    HPI Patient is in today for a comprehensive physical exam  STD: She is requesting an STD screening test.   Menstruation: She reports that her cycle is has pushed back to about a day  Cold: She reports of a cold the other week but symptoms are now resolved.   New Moles: She reports of new moles on her face  Blood Pressure: As of today's visit, her blood pressure is on the high end of normal range BP Readings from Last 3 Encounters:  06/01/22 137/83  05/26/21 135/71  05/11/20 (!) 120/93   Pulse Readings from Last 3 Encounters:  06/01/22 74  05/26/21 81  05/11/20 (!) 104   Acid Reflux: She reports that her acid reflux increased this year. She does no recall changing her diet. She states that she frequently consumes apple juice  She denies having any fever, hearing or vision symptoms, new muscle pain, joint pain , new moles, rashes, congestion, sinus pain, sore throat, palpations, cough, SOB ,wheezing,n/v/d constipation, blood in stool, dysuria, frequency, hematuria, depression, anxiety, headaches at this time  Social history: She reports no recent surgeries. She denies of any changes to her family medical history.  Pap Smear: Last completed on 05/11/2020 Immunizations: She is not interested in receiving an influenza vaccine during today's visit. She reports that she has received the first two Covid vaccines.  Diet: She is not maintaining a healthy diet Exercise: She is not regularly exercising  Dental: She is UTD on dental exams Vision: She is not UTD on vision exams  Health Maintenance Due  Topic Date Due   COVID-19 Vaccine (3 - Pfizer series)  05/27/2020    Past Medical History:  Diagnosis Date   Anemia    Genital warts    Herpes    History of chicken pox     Past Surgical History:  Procedure Laterality Date   CHOLECYSTECTOMY  2008    Family History  Problem Relation Age of Onset   Hypertension Father    Cancer Paternal Grandmother        breast   Cancer Paternal Grandfather        prostate    Social History   Socioeconomic History   Marital status: Single    Spouse name: Not on file   Number of children: Not on file   Years of education: Not on file   Highest education level: Not on file  Occupational History   Not on file  Tobacco Use   Smoking status: Never   Smokeless tobacco: Never  Substance and Sexual Activity   Alcohol use: Yes    Alcohol/week: 2.0 standard drinks of alcohol    Types: 2 Standard drinks or equivalent per week    Comment: social   Drug use: No   Sexual activity: Yes    Partners: Male    Birth control/protection: Pill, OCP  Other Topics Concern   Not on file  Social History Narrative   Single   Lives alone   Works in Press photographer for [employer]   Enjoys sleeping, sewing, cooking   No pets, non-smoker   Social  Determinants of Health   Financial Resource Strain: Not on file  Food Insecurity: Not on file  Transportation Needs: Not on file  Physical Activity: Not on file  Stress: Not on file  Social Connections: Not on file  Intimate Partner Violence: Not on file    Outpatient Medications Prior to Visit  Medication Sig Dispense Refill   Multiple Vitamins-Minerals (MULTIVITAMIN ADULTS) TABS      tacrolimus (PROTOPIC) 0.1 % ointment Apply to the face twice daily     norethindrone-ethinyl estradiol 1/35 (NORTREL 1/35, 28,) tablet Take 1 tablet by mouth daily. 84 tablet 4   No facility-administered medications prior to visit.    No Known Allergies  Review of Systems  Constitutional:  Negative for fever.  HENT:  Negative for congestion, sinus pain and  sore throat.   Respiratory:  Negative for cough, shortness of breath and wheezing.   Cardiovascular:  Negative for palpitations.  Gastrointestinal:  Negative for blood in stool, constipation, diarrhea, nausea and vomiting.  Genitourinary:  Negative for dysuria, frequency and hematuria.  Musculoskeletal:  Negative for joint pain and myalgias.  Skin:  Negative for rash.       (-) New Moles  Neurological:  Negative for headaches.  Psychiatric/Behavioral:  Negative for depression. The patient is not nervous/anxious.        Objective:    Physical Exam Constitutional:      General: She is not in acute distress.    Appearance: Normal appearance. She is not ill-appearing.  HENT:     Head: Normocephalic and atraumatic.     Right Ear: Tympanic membrane, ear canal and external ear normal.     Left Ear: Tympanic membrane, ear canal and external ear normal.  Eyes:     Extraocular Movements: Extraocular movements intact.     Pupils: Pupils are equal, round, and reactive to light.  Cardiovascular:     Rate and Rhythm: Normal rate and regular rhythm.     Heart sounds: Normal heart sounds. No murmur heard.    No gallop.  Pulmonary:     Effort: Pulmonary effort is normal. No respiratory distress.     Breath sounds: Normal breath sounds. No wheezing or rales.  Abdominal:     General: Bowel sounds are normal. There is no distension.     Palpations: Abdomen is soft.     Tenderness: There is no abdominal tenderness. There is no guarding.  Musculoskeletal:     Comments: 5/5 strength in both upper and lower extremities    Skin:    General: Skin is warm and dry.  Neurological:     Mental Status: She is alert and oriented to person, place, and time.     Deep Tendon Reflexes:     Reflex Scores:      Patellar reflexes are 2+ on the right side and 2+ on the left side. Psychiatric:        Mood and Affect: Mood normal.        Behavior: Behavior normal.        Judgment: Judgment normal.     BP  137/83   Pulse 74   Resp 18   Ht _0  (1.626 m)   Wt 239 lb (108.4 kg)   SpO2 100%   BMI 41.02 kg/m  Wt Readings from Last 3 Encounters:  06/01/22 239 lb (108.4 kg)  05/26/21 235 lb (106.6 kg)  05/11/20 199 lb 3.2 oz (90.4 kg)       Assessment & Plan:  Problem List Items Addressed This Visit       Unprioritized   Preventative health care - Primary    Discussed healthy diet and regular exercise. Recommended that she get covid vaccine at her pharmacy. Pap up to date.  Declines flu shot. Tetanus up to date. BP is borderline      Relevant Orders   Chlamydia/Gonococcus/Trichomonas, NAA   Comp Met (CMET)   CBC with Differential/Platelet   TSH   Lipid panel   Other Visit Diagnoses     Screening examination for STD (sexually transmitted disease)       Relevant Orders   Chlamydia/Gonococcus/Trichomonas, NAA   HIV antibody (with reflex)   RPR      Meds ordered this encounter  Medications   norethindrone-ethinyl estradiol 1/35 (NORTREL 1/35, 28,) tablet    Sig: Take 1 tablet by mouth daily.    Dispense:  84 tablet    Refill:  4    Order Specific Question:   Supervising Provider    Answer:   Penni Homans A [[id number]]    I, Nance Pear, NP, personally preformed the services described in this documentation.  All medical record entries made by the scribe were at my direction and in my presence.  I have reviewed the chart and discharge instructions (if applicable) and agree that the record reflects my personal performance and is accurate and complete. 06/01/2022   I,Amber Collins,acting as a scribe for Nance Pear, NP.,have documented all relevant documentation on the behalf of Nance Pear, NP,as directed by  Nance Pear, NP while in the presence of Nance Pear, NP.    Nance Pear, NP

## 2022-06-01 NOTE — Patient Instructions (Signed)
Please work on healthy diet and regular exercise. Try cutting out juices and adding some walking every day.

## 2022-06-02 LAB — COMPREHENSIVE METABOLIC PANEL
ALT: 20 IU/L (ref 0–32)
AST: 21 IU/L (ref 0–40)
Albumin/Globulin Ratio: 1.4 (ref 1.2–2.2)
Albumin: 4.1 g/dL (ref 3.9–4.9)
Alkaline Phosphatase: 62 IU/L (ref 44–121)
BUN/Creatinine Ratio: 13 (ref 9–23)
BUN: 11 mg/dL (ref 6–20)
Bilirubin Total: 0.4 mg/dL (ref 0.0–1.2)
CO2: 18 mmol/L — ABNORMAL LOW (ref 20–29)
Calcium: 9.6 mg/dL (ref 8.7–10.2)
Chloride: 100 mmol/L (ref 96–106)
Creatinine, Ser: 0.86 mg/dL (ref 0.57–1.00)
Globulin, Total: 2.9 g/dL (ref 1.5–4.5)
Glucose: 87 mg/dL (ref 70–99)
Potassium: 4.5 mmol/L (ref 3.5–5.2)
Sodium: 133 mmol/L — ABNORMAL LOW (ref 134–144)
Total Protein: 7 g/dL (ref 6.0–8.5)
eGFR: 89 mL/min/{1.73_m2} (ref >59)

## 2022-06-02 LAB — CBC WITH DIFFERENTIAL/PLATELET
Basophils Absolute: 0 10*3/uL (ref 0.0–0.2)
Basos: 0 %
EOS (ABSOLUTE): 0.1 10*3/uL (ref 0.0–0.4)
Eos: 1 %
Hematocrit: 37.7 % (ref 34.0–46.6)
Hemoglobin: 13 g/dL (ref 11.1–15.9)
Immature Grans (Abs): 0 10*3/uL (ref 0.0–0.1)
Immature Granulocytes: 0 %
Lymphocytes Absolute: 2.2 10*3/uL (ref 0.7–3.1)
Lymphs: 36 %
MCH: 31.6 pg (ref 26.6–33.0)
MCHC: 34.5 g/dL (ref 31.5–35.7)
MCV: 92 fL (ref 79–97)
Monocytes Absolute: 0.3 10*3/uL (ref 0.1–0.9)
Monocytes: 6 %
Neutrophils Absolute: 3.6 10*3/uL (ref 1.4–7.0)
Neutrophils: 57 %
Platelets: 328 10*3/uL (ref 150–450)
RBC: 4.12 x10E6/uL (ref 3.77–5.28)
RDW: 12 % (ref 11.7–15.4)
WBC: 6.2 10*3/uL (ref 3.4–10.8)

## 2022-06-02 LAB — LIPID PANEL
Chol/HDL Ratio: 3.4 ratio (ref 0.0–4.4)
Cholesterol, Total: 188 mg/dL (ref 100–199)
HDL: 55 mg/dL (ref >39)
LDL Chol Calc (NIH): 112 mg/dL — ABNORMAL HIGH (ref 0–99)
Triglycerides: 116 mg/dL (ref 0–149)
VLDL Cholesterol Cal: 21 mg/dL (ref 5–40)

## 2022-06-02 LAB — TSH: TSH: 2.35 u[IU]/mL (ref 0.450–4.500)

## 2022-06-02 LAB — HIV ANTIBODY (ROUTINE TESTING W REFLEX): HIV Screen 4th Generation wRfx: NONREACTIVE

## 2022-06-02 LAB — RPR: RPR Ser Ql: NONREACTIVE

## 2022-06-05 ENCOUNTER — Encounter: Payer: Self-pay | Admitting: Family

## 2022-06-05 LAB — CHLAMYDIA/GONOCOCCUS/TRICHOMONAS, NAA
Chlamydia by NAA: NEGATIVE
Gonococcus by NAA: NEGATIVE
Trich vag by NAA: NEGATIVE

## 2023-02-20 ENCOUNTER — Encounter: Payer: Self-pay | Admitting: Family

## 2023-02-21 MED ORDER — TYPHOID VACCINE PO CPDR: DELAYED_RELEASE_CAPSULE | ORAL | 0 refills | Status: DC

## 2023-02-21 MED ORDER — ATOVAQUONE-PROGUANIL HCL 250-100 MG PO TABS: ORAL_TABLET | ORAL | 0 refills | Status: DC

## 2023-02-21 NOTE — Telephone Encounter (Signed)
Patient notified of typhoid and malaria medications sent to pharmacy and she reports she started the twinrix series at her local  pharmacy.

## 2023-02-21 NOTE — Telephone Encounter (Signed)
Please advise pt that I have sent an rx for Malarone (malaria), Vivotif (typhoid).  I would also recommend Hep A and Hep B vaccination with Twinrix.  The accelerated regimen for twinrix is 1 mL on day 0, day 7, and days 21 to 30, followed by a booster at 12 months for a total of 4 doses.  Lets get as many doses in before she leaves as we can.

## 2023-02-21 NOTE — Addendum Note (Signed)
Addended by: Sandford Craze on: 02/21/2023 07:29 AM   Modules accepted: Orders

## 2023-02-21 NOTE — Telephone Encounter (Signed)
Lvm for patient to call back so we can arrange nv to start twinrix administrations

## 2023-05-31 ENCOUNTER — Other Ambulatory Visit (HOSPITAL_COMMUNITY)
Admission: RE | Admit: 2023-05-31 | Discharge: 2023-05-31 | Disposition: A | Discharge status: Home / Self Care | Payer: Managed Care, Other (non HMO) | Source: Ambulatory Visit | Attending: Family | Admitting: Family

## 2023-05-31 ENCOUNTER — Ambulatory Visit (INDEPENDENT_AMBULATORY_CARE_PROVIDER_SITE_OTHER): Payer: Managed Care, Other (non HMO) | Admitting: Family

## 2023-05-31 ENCOUNTER — Encounter: Payer: Self-pay | Admitting: Family

## 2023-05-31 VITALS — BP 136/83 | HR 91 | Temp 97.8°F | Resp 16 | Ht 64.0 in | Wt 235.0 lb

## 2023-05-31 DIAGNOSIS — Z0000 Encounter for general adult medical examination without abnormal findings: Secondary | ICD-10-CM | POA: Diagnosis not present

## 2023-05-31 DIAGNOSIS — Z01419 Encounter for gynecological examination (general) (routine) without abnormal findings: Secondary | ICD-10-CM | POA: Diagnosis present

## 2023-05-31 DIAGNOSIS — Z23 Encounter for immunization: Secondary | ICD-10-CM

## 2023-05-31 DIAGNOSIS — Z113 Encounter for screening for infections with a predominantly sexual mode of transmission: Secondary | ICD-10-CM | POA: Insufficient documentation

## 2023-05-31 LAB — COMPREHENSIVE METABOLIC PANEL
ALT: 15 U/L (ref 0–35)
AST: 17 U/L (ref 0–37)
Albumin: 4.2 g/dL (ref 3.5–5.2)
Alkaline Phosphatase: 54 U/L (ref 39–117)
BUN: 10 mg/dL (ref 6–23)
CO2: 23 meq/L (ref 19–32)
Calcium: 9.3 mg/dL (ref 8.4–10.5)
Chloride: 102 meq/L (ref 96–112)
Creatinine, Ser: 0.87 mg/dL (ref 0.40–1.20)
GFR: 84.22 mL/min (ref >60.00)
Glucose, Bld: 89 mg/dL (ref 70–99)
Potassium: 4.4 meq/L (ref 3.5–5.1)
Sodium: 134 meq/L — ABNORMAL LOW (ref 135–145)
Total Bilirubin: 0.7 mg/dL (ref 0.2–1.2)
Total Protein: 7.5 g/dL (ref 6.0–8.3)

## 2023-05-31 LAB — LIPID PANEL
Cholesterol: 210 mg/dL — ABNORMAL HIGH (ref 0–200)
HDL: 60.8 mg/dL (ref >39.00)
LDL Cholesterol: 120 mg/dL — ABNORMAL HIGH (ref 0–99)
NonHDL: 149.37
Total CHOL/HDL Ratio: 3
Triglycerides: 145 mg/dL (ref 0.0–149.0)
VLDL: 29 mg/dL (ref 0.0–40.0)

## 2023-05-31 MED ORDER — NORTREL 1/35 (28) 1-35 MG-MCG PO TABS: 1.0000 | ORAL_TABLET | Freq: Every day | ORAL | 4 refills | Status: DC

## 2023-05-31 NOTE — Patient Instructions (Signed)
VISIT SUMMARY:  During your visit today, we addressed several of your health concerns and performed routine screenings. We discussed your HPV status and performed a Pap smear and HPV test. We also addressed your persistent cough, dietary habits, and blood pressure. Additionally, we administered a tetanus vaccine and conducted routine STD screening.  YOUR PLAN:  -CERVICAL CANCER SCREENING: Cervical cancer screening helps detect abnormal cells in the cervix that could develop into cancer. Although your last Pap smear was normal and not due for another five years, we performed the Pap smear and HPV test today to address your concerns.  -STD SCREENING: Routine STD screening helps detect sexually transmitted diseases early. We performed the screening today as requested.  -TETANUS VACCINATION: Tetanus is a serious bacterial infection that affects the nervous system. Since your last tetanus shot was in 2015, we administered an updated vaccine today.  -PERSISTENT COUGH: A persistent cough can be caused by various factors, including postnasal drip or a lingering viral infection. We recommend monitoring your symptoms and contacting our office if there is no improvement in 3-4 weeks.  -PREHYPERTENSION: Prehypertension is when blood pressure readings are higher than normal but not yet in the high blood pressure range. We advise focusing on a low sodium diet, regular exercise, and weight loss to help manage your blood pressure and delay the need for medication.  -GENERAL HEALTH MAINTENANCE: We recommend scheduling an eye exam and continuing your current medications, including birth control and protopic ointment. We also checked your cholesterol and glucose levels as part of routine lab work. You declined the flu shot and COVID booster.  INSTRUCTIONS:  Please monitor your cough and contact our office if it does not improve in 3-4 weeks. Focus on maintaining a low sodium diet, regular exercise, and weight loss  to manage your blood pressure. Schedule an eye exam and continue your current medications. Follow up with [place] for any concerns or if you need further assistance.

## 2023-05-31 NOTE — Assessment & Plan Note (Signed)
Discussed healthy diet, exercise, weight loss.  Also discussed borderline blood pressure reading and need to focus on low sodium diet. Td today.  Declines flu shot and covid booster. Requested pap to be performed today.

## 2023-05-31 NOTE — Progress Notes (Signed)
Subjective:     Patient ID: Erin English, female    DOB: 02/17/1986, 37 y.o.   MRN: [id number]  No chief complaint on file.   HPI  Discussed the use of AI scribe software for clinical note transcription with the patient, who gave verbal consent to proceed.  History of Present Illness   Erin English, a patient with a history of HPV, presents for a routine physical examination. She expresses concern about her HPV status and requests a Pap smear, despite her last one being normal and not due for another five years. She reports no current symptoms related to HPV.  In addition, Erin English has been experiencing a persistent cough for about a month. The cough started as a minor symptom but worsened during her vacation, with the production of phlegm. She noticed some improvement but reports that the cough seems to be resurfacing. She denies any associated symptoms such as fever, chest pain, or difficulty breathing. She does note some chronic post nasal drip for which she takes a daily antihistamine  Erin English also reports struggling with maintaining a healthy diet, often resorting to fast food due to her dislike for cooking. She lives alone, which she acknowledges makes it challenging to cook for herself. She also reports a recent lapse in her exercise routine, which she had started at a local gym but has not continued since returning from vacation.  She is currently on birth control and uses protopic ointment and betamethasone lotion eczema. She reports that the betamethasone lotion seems to be keeping her scalp condition stable.  Erin English's blood pressure readings have been slightly elevated in recent visits, hovering around the early hypertension stage. She expresses a strong desire to avoid medication, having successfully weaned off several medications in the past.      Patient presents today for complete physical.  Immunizations: Td today, declines flu Diet: needs improvement Exercise: some  exercise, plans to start back up regularly Pap Smear: due 10/26 but wishes to do today Vision: due Dental: up to date BP Readings from Last 3 Encounters:  05/31/23 136/83  06/01/22 137/83  05/26/21 135/71        Health Maintenance Due  Topic Date Due   COVID-19 Vaccine (3 - 2023-24 season) 03/26/2023    Past Medical History:  Diagnosis Date   Anemia    Genital warts    Herpes    History of chicken pox     Past Surgical History:  Procedure Laterality Date   CHOLECYSTECTOMY  2008    Family History  Problem Relation Age of Onset   Hypertension Father    Cancer Paternal Grandmother        breast   Cancer Paternal Grandfather        prostate    Social History   Socioeconomic History   Marital status: Single    Spouse name: Not on file   Number of children: Not on file   Years of education: Not on file   Highest education level: Not on file  Occupational History   Not on file  Tobacco Use   Smoking status: Never   Smokeless tobacco: Never  Substance and Sexual Activity   Alcohol use: Yes    Alcohol/week: 2.0 standard drinks of alcohol    Types: 2 Standard drinks or equivalent per week    Comment: social   Drug use: No   Sexual activity: Yes    Partners: Male    Birth control/protection: Pill, OCP  Other Topics Concern  Not on file  Social History Narrative   Single   Lives alone   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 Not to face.     Multiple  Vitamins-Minerals (MULTIVITAMIN ADULTS) TABS      tacrolimus (PROTOPIC) 0.1 % ointment Apply to the face twice daily     norethindrone-ethinyl estradiol 1/35 (NORTREL 1/35, 28,) tablet Take 1 tablet by mouth daily. 84 tablet 4   atovaquone-proguanil (MALARONE) 250-100 MG TABS tablet start 1 to 2 days prior to entering a malaria-endemic area, continue throughout the stay and for 7 days after returning 24 tablet 0   typhoid (VIVOTIF) DR capsule One capsule on alternate days (day 1, 3, 5, and 7) for a total of 4 doses; all doses should be complete at least 1 week prior to potential exposure. 4 capsule 0   No facility-administered medications prior to visit.    No Known Allergies  Review of Systems  Constitutional:  Negative for weight loss.  HENT:  Negative for congestion and hearing loss.   Eyes:  Negative for blurred vision.  Respiratory:  Positive for cough (for 1 month).   Cardiovascular:  Negative for leg swelling.  Gastrointestinal:  Negative for constipation and diarrhea.  Genitourinary:  Negative for dysuria and urgency.  Musculoskeletal:  Negative for joint pain and myalgias.  Skin:  Negative for rash.  Neurological:  Negative for headaches.  Psychiatric/Behavioral:  Negative for depression. The patient is not nervous/anxious.        Objective:    Physical Exam   BP 136/83 (BP Location: Right Arm, Patient Position: Sitting, Cuff Size: Large)   Pulse 91   Temp 97.8 F (36.6 C) (Oral)   Resp 16   Ht 5\' 4"  (1.626 m)   Wt 235 lb (106.6 kg)   SpO2 99%   BMI 40.34 kg/m  Wt Readings from Last 3 Encounters:  05/31/23 235 lb (106.6 kg)  06/01/22 239 lb (108.4 kg)  05/26/21 235 lb (106.6 kg)   Physical Exam  Constitutional: She is oriented to person, place, and time. She appears well-developed and well-nourished. No distress.  HENT:  Head: Normocephalic and atraumatic.  Right Ear: Tympanic membrane and ear canal normal.  Left Ear: Tympanic membrane and ear canal normal.   Mouth/Throat: Oropharynx is clear and moist.  Eyes: Pupils are equal, round, and reactive to light. No scleral icterus.  Neck: Normal range of motion. No thyromegaly present.  Cardiovascular: Normal rate and regular rhythm.   No murmur heard. Pulmonary/Chest: Effort normal and breath sounds normal. No respiratory distress. He has no wheezes. She has no rales. She exhibits no tenderness.  Abdominal: Soft. Bowel sounds are normal. She exhibits no distension and no mass. There is no tenderness. There is no rebound and no guarding.  Musculoskeletal: She exhibits no edema.  Lymphadenopathy:    She has no cervical adenopathy.  Neurological: She is alert and oriented to person, place, and time. She has normal patellar reflexes.  She exhibits normal muscle tone. Coordination normal.  Skin: Skin is warm and dry.  Psychiatric: She has a normal mood and affect. Her behavior is normal. Judgment and thought content normal.  Breasts: Examined lying Right: Without masses, retractions, discharge or axillary adenopathy.  Left: Without masses, retractions, discharge or axillary adenopathy.  Inguinal/mons: Normal without inguinal adenopathy  External genitalia: Normal  BUS/Urethra/Skene's glands: Normal  Bladder: Normal  Vagina: Normal  Cervix: Normal  Uterus: normal in size, shape and contour. Midline and mobile  Adnexa/parametria:  Rt: Without masses or tenderness.  Lt: Without masses or tenderness.  Anus and perineum: Normal            Assessment & Plan:        Assessment & Plan:   Problem List Items Addressed This Visit       Unprioritized   Preventative health care - Primary    Discussed healthy diet, exercise, weight loss.  Also discussed borderline blood pressure reading and need to focus on low sodium diet. Td today.  Declines flu shot and covid booster. Requested pap to be performed today.       Relevant Orders   Lipid panel   Comp Met (CMET)   Other Visit Diagnoses      Screen for STD (sexually transmitted disease)       Relevant Orders   HIV antibody (with reflex)   RPR   Cervicovaginal ancillary only( [facility])   Need for Td vaccine       Relevant Orders   Td : Tetanus/diphtheria >7yo Preservative  free (Completed)   Encounter for routine gynecological examination with Papanicolaou smear of cervix       Relevant Orders   Cytology - PAP( [facility])       I have discontinued Jkayla Feasel's atovaquone-proguanil and typhoid. I am also having her maintain her Multivitamin Adults, tacrolimus, betamethasone (augmented), and Nortrel 1/35 (28).  Meds ordered this encounter  Medications   norethindrone-ethinyl estradiol 1/35 (NORTREL 1/35, 28,) tablet    Sig: Take 1 tablet by mouth daily.    Dispense:  84 tablet    Refill:  4

## 2023-06-01 ENCOUNTER — Encounter: Payer: Self-pay | Admitting: Family

## 2023-06-01 LAB — CERVICOVAGINAL ANCILLARY ONLY
Chlamydia: NEGATIVE
Comment: NEGATIVE
Comment: NEGATIVE
Comment: NORMAL
Neisseria Gonorrhea: NEGATIVE
Trichomonas: NEGATIVE

## 2023-06-01 LAB — HIV ANTIBODY (ROUTINE TESTING W REFLEX): HIV 1&2 Ab, 4th Generation: NONREACTIVE

## 2023-06-01 LAB — RPR: RPR Ser Ql: NONREACTIVE

## 2023-06-02 ENCOUNTER — Encounter: Payer: Managed Care, Other (non HMO) | Admitting: Family

## 2023-06-02 LAB — CYTOLOGY - PAP
Adequacy: ABSENT
Comment: NEGATIVE
Diagnosis: NEGATIVE
High risk HPV: NEGATIVE

## 2023-06-02 NOTE — Telephone Encounter (Signed)
Dear [name]/[name]- please see mychart message.

## 2023-06-05 ENCOUNTER — Encounter: Payer: Self-pay | Admitting: Family

## 2023-11-06 ENCOUNTER — Encounter: Payer: Self-pay | Admitting: Family

## 2023-11-08 ENCOUNTER — Encounter: Payer: Self-pay | Admitting: Family

## 2023-11-09 MED ORDER — YELLOW FEVER VACCINE ~~LOC~~ INJ: 0.5000 mL | INJECTION | Freq: Once | SUBCUTANEOUS | 0 refills | Status: AC

## 2024-04-09 ENCOUNTER — Encounter: Payer: Self-pay | Admitting: Family

## 2024-04-09 NOTE — Telephone Encounter (Signed)
 Can you double check my schedule please and see how far out we are booking physicals?  Maybe we can squeeze her in sooner? Also, pt requesting note be placed that she wants STD testing and HIV testing at her physical.

## 2024-04-10 ENCOUNTER — Other Ambulatory Visit: Payer: Self-pay

## 2024-05-15 ENCOUNTER — Ambulatory Visit (INDEPENDENT_AMBULATORY_CARE_PROVIDER_SITE_OTHER): Admitting: Family

## 2024-05-15 ENCOUNTER — Other Ambulatory Visit (HOSPITAL_COMMUNITY)
Admission: RE | Admit: 2024-05-15 | Discharge: 2024-05-15 | Disposition: A | Discharge status: Home / Self Care | Source: Ambulatory Visit | Attending: Family | Admitting: Family

## 2024-05-15 ENCOUNTER — Encounter: Payer: Self-pay | Admitting: Family

## 2024-05-15 VITALS — BP 118/74 | HR 83 | Temp 98.4°F | Resp 16 | Ht 64.0 in | Wt 226.8 lb

## 2024-05-15 DIAGNOSIS — Z113 Encounter for screening for infections with a predominantly sexual mode of transmission: Secondary | ICD-10-CM | POA: Insufficient documentation

## 2024-05-15 DIAGNOSIS — Z0000 Encounter for general adult medical examination without abnormal findings: Secondary | ICD-10-CM

## 2024-05-15 NOTE — Assessment & Plan Note (Signed)
 Declines flu shot Routine visit with no new concerns. Weight loss attributed to increased activity. No issues with mental health, sensory functions, or gastrointestinal and musculoskeletal systems. Pap smear current. - Perform physical examination. - Discuss self-breast exam technique  - Encourage continued healthy diet, exercise and weight loss efforts.  - Schedule mammogram post-birthday.

## 2024-05-15 NOTE — Addendum Note (Signed)
 Addended by: ESTELLE GILLIS D on: 05/15/2024 02:49 PM   Modules accepted: Orders

## 2024-05-15 NOTE — Progress Notes (Signed)
 Subjective:     Patient ID: Erin English, female    DOB: 1985/12/05, 38 y.o.   MRN: [id number]  Chief Complaint  Patient presents with   Annual Exam    HPI  Discussed the use of AI scribe software for clinical note transcription with the patient, who gave verbal consent to proceed.  History of Present Illness  Erin English is a 38 year old female who presents for an annual physical exam and work-related lab tests.  She requests a full STD panel, including hepatitis B, hepatitis C, and syphilis screening, but declines herpes testing due to a known positive history. She has no vaginal discharge or symptoms of concern. She has not received a flu shot this year and is uncertain about having had one in the past.  She is on Nortrel  for birth control without issues. She exercises by walking three times a week and has lost nine pounds since her last visit, attributing this to increased physical activity.  She has fibrocystic breast changes and is uncertain about self-examinations, noting difficulty distinguishing between cysts and potential abnormalities.  Immunizations:  declines flu shot Diet: healthy Exercise: improved- exercises several days/week.  Pap Smear: 2024 normal Mammogram: will be due after her birthday Vision: due- will schedule Dental: up to date     Health Maintenance Due  Topic Date Due   Pneumococcal Vaccine (1 of 2 - PCV) Never done   Hepatitis B Vaccines 19-59 Average Risk (1 of 3 - 19+ 3-dose series) Never done   HPV VACCINES (1 - 3-dose SCDM series) Never done   COVID-19 Vaccine (3 - 2025-26 season) 03/25/2024    Past Medical History:  Diagnosis Date   Anemia    Genital warts    Herpes    History of chicken pox     Past Surgical History:  Procedure Laterality Date   CHOLECYSTECTOMY  2008    Family History  Problem Relation Age of Onset   Hypertension Father    Cancer Paternal Grandmother        breast   Cancer Paternal Grandfather         prostate    Social History   Socioeconomic History   Marital status: Single    Spouse name: Not on file   Number of children: Not on file   Years of education: Not on file   Highest education level: Not on file  Occupational History   Not on file  Tobacco Use   Smoking status: Never   Smokeless tobacco: Never  Substance and Sexual Activity   Alcohol use: Yes    Alcohol/week: 2.0 standard drinks of alcohol    Types: 2 Standard drinks or equivalent per week    Comment: social   Drug use: No   Sexual activity: Yes    Partners: Male    Birth control/protection: Pill, OCP  Other Topics Concern   Not on file  Social History Narrative   Single   Lives alone   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   Enjoys sleeping, sewing, cooking   No pets, non-smoker   Social Drivers of Corporate investment banker Strain: Not on file  Food Insecurity: Not on file  Transportation Needs: Not on file  Physical Activity: Sufficiently Active (05/15/2024)   Exercise Vital Sign    Days of Exercise per Week: 5 days    Minutes of Exercise per Session: 60 min  Stress: No Stress Concern Present (05/15/2024)   [place]  [employer] - Occupational Stress Questionnaire    Feeling of Stress: Not at all  Social Connections: Not on file  Intimate Partner Violence: Not on file    Outpatient Medications Prior to Visit  Medication Sig Dispense Refill   fluocinonide ointment (LIDEX) 0.05 % Apply to scalp 3-4 times per week. Not to face.     Multiple Vitamins-Minerals (MULTIVITAMIN ADULTS) TABS      norethindrone -ethinyl estradiol  1/35 (NORTREL  1/35, 28,) tablet Take 1 tablet by mouth daily. 84 tablet 4   tacrolimus (PROTOPIC) 0.1 % ointment Apply to the face twice daily     betamethasone, augmented, (DIPROLENE) 0.05 % lotion Apply to scalp 4 times per week. Not to face.     No facility-administered medications prior to visit.    No Known Allergies  Review of  Systems  Constitutional:  Positive for weight loss.  HENT:  Negative for congestion and hearing loss.   Eyes:  Negative for blurred vision.  Respiratory:  Negative for cough.   Cardiovascular:  Negative for leg swelling.  Gastrointestinal:  Negative for constipation and diarrhea.  Genitourinary:  Negative for dysuria and frequency.  Musculoskeletal:  Negative for joint pain and myalgias.  Skin:  Negative for rash.  Neurological:  Negative for headaches.  Psychiatric/Behavioral:  Negative for depression. The patient is not nervous/anxious.        Objective:    Physical Exam Exam conducted with a chaperone present.      BP 118/74 (BP Location: Right Arm, Patient Position: Sitting, Cuff Size: Large)   Pulse 83   Temp 98.4 F (36.9 C) (Oral)   Resp 16   Ht 5' 4 (1.626 m)   Wt 226 lb 12.8 oz (102.9 kg)   SpO2 99%   BMI 38.93 kg/m  Wt Readings from Last 3 Encounters:  05/15/24 226 lb 12.8 oz (102.9 kg)  05/31/23 235 lb (106.6 kg)  06/01/22 239 lb (108.4 kg)  Physical Exam  Constitutional: She is oriented to person, place, and time. She appears well-developed and well-nourished. No distress.  HENT:  Head: Normocephalic and atraumatic.  Right Ear: Tympanic membrane and ear canal normal.  Left Ear: Tympanic membrane and ear canal normal.  Mouth/Throat: Oropharynx is clear and moist.  Eyes: Pupils are equal, round, and reactive to light. No scleral icterus.  Neck: Normal range of motion. No thyromegaly present.  Cardiovascular: Normal rate and regular rhythm.   No murmur heard. Pulmonary/Chest: Effort normal and breath sounds normal. No respiratory distress. He has no wheezes. She has no rales. She exhibits no tenderness.  Abdominal: Soft. Bowel sounds are normal. She exhibits no distension and no mass. There is no tenderness. There is no rebound and no guarding.  Musculoskeletal: She exhibits no edema.  Lymphadenopathy:    She has no cervical adenopathy.  Neurological:  She is alert and oriented to person, place, and time. She has normal patellar reflexes. She exhibits normal muscle tone. Coordination normal.  Skin: Skin is warm and dry.  Psychiatric: She has a normal mood and affect. Her behavior is normal. Judgment and thought content normal.  Breasts: Examined lying Right: Without masses, retractions, discharge or axillary adenopathy.  Left: Without masses, retractions, discharge or axillary adenopathy.  Inguinal/mons: Normal without inguinal adenopathy  External genitalia: Normal  Vagina: Normal  Anus and perineum: Normal            Assessment & Plan:        Assessment & Plan:   Problem List  Items Addressed This Visit       Unprioritized   Preventative health care   Declines flu shot Routine visit with no new concerns. Weight loss attributed to increased activity. No issues with mental health, sensory functions, or gastrointestinal and musculoskeletal systems. Pap smear current. - Perform physical examination. - Discuss self-breast exam technique  - Encourage continued healthy diet, exercise and weight loss efforts.  - Schedule mammogram post-birthday.      Relevant Orders   Lipid panel   Comp Met (CMET)   Other Visit Diagnoses       Routine screening for STI (sexually transmitted infection)    -  Primary   Relevant Orders   Cervicovaginal ancillary only( [place] [facility])   HepB+HepC+HIV Panel   RPR       I have discontinued Marcine Abdullah's betamethasone (augmented). I am also having her maintain her Multivitamin Adults, tacrolimus, Nortrel  1/35 (28), and fluocinonide ointment.  No orders of the defined types were placed in this encounter.

## 2024-05-15 NOTE — Patient Instructions (Signed)
 VISIT SUMMARY:  Today, you had your annual physical exam and work-related lab tests. We discussed your recent weight loss, your birth control, and your request for a full STD panel. We also talked about your fibrocystic breast changes and the importance of self-examinations.  YOUR PLAN:  ADULT WELLNESS VISIT: Routine visit with no new concerns. Weight loss attributed to increased activity. No issues with mental health, sensory functions, or gastrointestinal and musculoskeletal systems. Pap smear current. -Performed physical examination. -Discussed self-breast exam technique and how to differentiate fibrocystic changes from potential malignancies. -Encouraged you to continue with your healthy diet and exercise. -Schedule a mammogram after your birthday.  SCREENING FOR SEXUALLY TRANSMITTED INFECTIONS: Screening requested by employer. No symptoms. Herpes testing not required due to previous diagnosis. -Ordered full STD panel including hepatitis B, hepatitis C, and syphilis. -Performed vaginal swab.  SCREENING FOR LIPID DISORDER: Lipid screening required by employer. -Ordered lipid panel.  SCREENING FOR DIABETES MELLITUS: Diabetes screening required by employer. -Ordered blood glucose test.  CONTRACEPTIVE MANAGEMENT: Continues Nortrel  without issues. -Continue taking Nortrel  for birth control.  GENERAL HEALTH MAINTENANCE: Flu shot not received. Dental exams current, eye exams not. -Administer flu shot if desired. -Recommend routine eye exams.

## 2024-05-16 ENCOUNTER — Other Ambulatory Visit: Payer: Self-pay | Admitting: Emergency Medicine

## 2024-05-16 DIAGNOSIS — Z0000 Encounter for general adult medical examination without abnormal findings: Secondary | ICD-10-CM

## 2024-05-16 LAB — CERVICOVAGINAL ANCILLARY ONLY
Chlamydia: NEGATIVE
Comment: NEGATIVE
Comment: NEGATIVE
Comment: NORMAL
Neisseria Gonorrhea: NEGATIVE
Trichomonas: NEGATIVE

## 2024-05-16 LAB — RPR: RPR Ser Ql: NONREACTIVE

## 2024-05-16 NOTE — Addendum Note (Signed)
 Addended by: ESTELLE GILLIS D on: 05/16/2024 09:21 AM   Modules accepted: Orders

## 2024-05-17 ENCOUNTER — Ambulatory Visit: Payer: Self-pay | Admitting: Family

## 2024-05-17 ENCOUNTER — Telehealth: Payer: Self-pay | Admitting: Family

## 2024-05-17 DIAGNOSIS — R7989 Other specified abnormal findings of blood chemistry: Secondary | ICD-10-CM | POA: Insufficient documentation

## 2024-05-17 LAB — HEPB+HEPC+HIV PANEL
HIV Screen 4th Generation wRfx: NONREACTIVE
Hep B C IgM: NEGATIVE
Hep B Core Total Ab: NEGATIVE
Hep B E Ab: NONREACTIVE
Hep B E Ag: NEGATIVE
Hep B Surface Ab, Qual: REACTIVE
Hep C Virus Ab: NONREACTIVE
Hepatitis B Surface Ag: NEGATIVE

## 2024-05-17 LAB — COMPREHENSIVE METABOLIC PANEL WITH GFR
AG Ratio: 1.4 (calc) (ref 1.0–2.5)
ALT: 211 U/L — ABNORMAL HIGH (ref 6–29)
AST: 86 U/L — ABNORMAL HIGH (ref 10–30)
Albumin: 4.4 g/dL (ref 3.6–5.1)
Alkaline phosphatase (APISO): 58 U/L (ref 31–125)
BUN: 15 mg/dL (ref 7–25)
CO2: 21 mmol/L (ref 20–32)
Calcium: 9.6 mg/dL (ref 8.6–10.2)
Chloride: 101 mmol/L (ref 98–110)
Creat: 0.85 mg/dL (ref 0.50–0.97)
Globulin: 3.2 g/dL (ref 1.9–3.7)
Glucose, Bld: 78 mg/dL (ref 65–99)
Potassium: 4.4 mmol/L (ref 3.5–5.3)
Sodium: 133 mmol/L — ABNORMAL LOW (ref 135–146)
Total Bilirubin: 0.6 mg/dL (ref 0.2–1.2)
Total Protein: 7.6 g/dL (ref 6.1–8.1)
eGFR: 89 mL/min/1.73m2 (ref >=60)

## 2024-05-17 LAB — LIPID PANEL
Cholesterol: 211 mg/dL — ABNORMAL HIGH (ref <200)
HDL: 58 mg/dL (ref >=50)
LDL Cholesterol (Calc): 128 mg/dL — ABNORMAL HIGH
Non-HDL Cholesterol (Calc): 153 mg/dL — ABNORMAL HIGH (ref <130)
Total CHOL/HDL Ratio: 3.6 (calc) (ref <5.0)
Triglycerides: 136 mg/dL (ref <150)

## 2024-05-17 NOTE — Telephone Encounter (Addendum)
 Please advise pt that her liver testing is abnormal.  I would like for her to complete an abdominal US  to look at her liver and gallbladder.    I would also like for her to do some additional lab testing related to her liver.   Does she drink alcohol and if so how much/how often?   Is she taking any supplements/herbal remedies?

## 2024-05-17 NOTE — Progress Notes (Signed)
 Patient made aware of lab results and recomendations

## 2024-05-17 NOTE — Telephone Encounter (Signed)
 Copied from CRM [id number]. Topic: Clinical - Lab/Test Results >> May 17, 2024  2:09 PM Anairis L wrote: Reason for CRM: Patient returning phone call regarding labs.

## 2024-05-17 NOTE — Telephone Encounter (Signed)
 Patient was notified of results and provider's recommendations. She reports she drinks on weekends up to 2-3 mixed drinks or shots. No herbal supplements reported.  She was scheduled for labs next week.

## 2024-05-20 ENCOUNTER — Other Ambulatory Visit: Payer: Self-pay

## 2024-05-20 ENCOUNTER — Encounter: Payer: Self-pay | Admitting: Family

## 2024-05-20 ENCOUNTER — Ambulatory Visit (HOSPITAL_BASED_OUTPATIENT_CLINIC_OR_DEPARTMENT_OTHER)
Admission: RE | Admit: 2024-05-20 | Discharge: 2024-05-20 | Disposition: A | Discharge status: Home / Self Care | Source: Ambulatory Visit | Attending: Family | Admitting: Family

## 2024-05-20 DIAGNOSIS — R7989 Other specified abnormal findings of blood chemistry: Secondary | ICD-10-CM | POA: Insufficient documentation

## 2024-05-20 MED ORDER — NORTREL 1/35 (28) 1-35 MG-MCG PO TABS: 1.0000 | ORAL_TABLET | Freq: Every day | ORAL | 4 refills | Status: AC

## 2024-05-21 ENCOUNTER — Ambulatory Visit: Payer: Self-pay | Admitting: Family

## 2024-05-21 ENCOUNTER — Other Ambulatory Visit (INDEPENDENT_AMBULATORY_CARE_PROVIDER_SITE_OTHER)

## 2024-05-21 DIAGNOSIS — R7989 Other specified abnormal findings of blood chemistry: Secondary | ICD-10-CM

## 2024-05-22 LAB — CBC WITH DIFFERENTIAL/PLATELET
Absolute Lymphocytes: 2388 {cells}/uL (ref 850–3900)
Absolute Monocytes: 414 {cells}/uL (ref 200–950)
Basophils Absolute: 18 {cells}/uL (ref 0–200)
Basophils Relative: 0.3 %
Eosinophils Absolute: 90 {cells}/uL (ref 15–500)
Eosinophils Relative: 1.5 %
HCT: 38.1 % (ref 35.0–45.0)
Hemoglobin: 12.5 g/dL (ref 11.7–15.5)
MCH: 30.9 pg (ref 27.0–33.0)
MCHC: 32.8 g/dL (ref 32.0–36.0)
MCV: 94.3 fL (ref 80.0–100.0)
MPV: 12.2 fL (ref 7.5–12.5)
Monocytes Relative: 6.9 %
Neutro Abs: 3090 {cells}/uL (ref 1500–7800)
Neutrophils Relative %: 51.5 %
Platelets: 228 Thousand/uL (ref 140–400)
RBC: 4.04 Million/uL (ref 3.80–5.10)
RDW: 12.1 % (ref 11.0–15.0)
Total Lymphocyte: 39.8 %
WBC: 6 Thousand/uL (ref 3.8–10.8)

## 2024-05-22 LAB — PROTIME-INR
INR: 1
Prothrombin Time: 10.4 s (ref 9.0–11.5)

## 2024-05-22 LAB — HEPATIC FUNCTION PANEL
AG Ratio: 1.3 (calc) (ref 1.0–2.5)
ALT: 102 U/L — ABNORMAL HIGH (ref 6–29)
AST: 37 U/L — ABNORMAL HIGH (ref 10–30)
Albumin: 3.8 g/dL (ref 3.6–5.1)
Alkaline phosphatase (APISO): 47 U/L (ref 31–125)
Bilirubin, Direct: 0.1 mg/dL (ref 0.0–0.2)
Globulin: 2.9 g/dL (ref 1.9–3.7)
Indirect Bilirubin: 0.4 mg/dL (ref 0.2–1.2)
Total Bilirubin: 0.5 mg/dL (ref 0.2–1.2)
Total Protein: 6.7 g/dL (ref 6.1–8.1)

## 2024-05-31 ENCOUNTER — Encounter: Admitting: Family

## 2024-07-08 ENCOUNTER — Other Ambulatory Visit: Payer: Self-pay | Admitting: Family

## 2024-07-08 DIAGNOSIS — Z1231 Encounter for screening mammogram for malignant neoplasm of breast: Secondary | ICD-10-CM

## 2024-09-23 ENCOUNTER — Ambulatory Visit (HOSPITAL_BASED_OUTPATIENT_CLINIC_OR_DEPARTMENT_OTHER)

## 2024-09-24 ENCOUNTER — Ambulatory Visit (HOSPITAL_BASED_OUTPATIENT_CLINIC_OR_DEPARTMENT_OTHER)
